# Patient Record
Sex: Female | Born: 1990 | Race: White | Hispanic: No | Marital: Married | Smoking: Never smoker
Health system: Southern US, Community
[De-identification: ages and names within clinical notes are randomized; demographics above are authoritative.]

## PROBLEM LIST (undated history)

## (undated) ENCOUNTER — Ambulatory Visit: Discharge status: Absent without leave | Payer: Medicaid Other

## (undated) DIAGNOSIS — F419 Anxiety disorder, unspecified: Secondary | ICD-10-CM

## (undated) DIAGNOSIS — K439 Ventral hernia without obstruction or gangrene: Secondary | ICD-10-CM

## (undated) DIAGNOSIS — Z8041 Family history of malignant neoplasm of ovary: Secondary | ICD-10-CM

## (undated) DIAGNOSIS — Z87898 Personal history of other specified conditions: Secondary | ICD-10-CM

## (undated) DIAGNOSIS — F32A Depression, unspecified: Secondary | ICD-10-CM

## (undated) DIAGNOSIS — R87619 Unspecified abnormal cytological findings in specimens from cervix uteri: Secondary | ICD-10-CM

## (undated) HISTORY — DX: Family history of malignant neoplasm of ovary: Z80.41

## (undated) HISTORY — DX: Ventral hernia without obstruction or gangrene: K43.9

## (undated) HISTORY — PX: DENTAL SURGERY: SHX609

## (undated) HISTORY — DX: Personal history of other specified conditions: Z87.898

## (undated) HISTORY — DX: Unspecified abnormal cytological findings in specimens from cervix uteri: R87.619

---

## 2017-06-28 ENCOUNTER — Ambulatory Visit: Payer: Self-pay | Admitting: Nurse Practitioner

## 2017-09-11 ENCOUNTER — Ambulatory Visit: Payer: Self-pay | Admitting: Nurse Practitioner

## 2018-01-29 HISTORY — PX: GASTRIC BYPASS: SHX52

## 2018-03-06 ENCOUNTER — Other Ambulatory Visit: Payer: Self-pay

## 2018-03-06 ENCOUNTER — Ambulatory Visit (INDEPENDENT_AMBULATORY_CARE_PROVIDER_SITE_OTHER): Payer: Medicaid Other | Admitting: Nurse Practitioner

## 2018-03-06 ENCOUNTER — Encounter: Payer: Self-pay | Admitting: Nurse Practitioner

## 2018-03-06 VITALS — BP 103/57 | HR 90 | Temp 98.5°F | Resp 17 | Ht 64.5 in | Wt 256.6 lb

## 2018-03-06 DIAGNOSIS — Z7689 Persons encountering health services in other specified circumstances: Secondary | ICD-10-CM

## 2018-03-06 DIAGNOSIS — F411 Generalized anxiety disorder: Secondary | ICD-10-CM

## 2018-03-06 DIAGNOSIS — F5104 Psychophysiologic insomnia: Secondary | ICD-10-CM | POA: Diagnosis not present

## 2018-03-06 MED ORDER — VENLAFAXINE HCL ER 37.5 MG PO CP24: ORAL_CAPSULE | ORAL | 0 refills | Status: DC

## 2018-03-06 NOTE — Progress Notes (Signed)
Subjective:    Patient ID: Tiffany Rhodes, female    DOB: 09-27-1990, 28 y.o.   MRN: [id number]  Tiffany Rhodes is a 28 y.o. female presenting on 03/06/2018 for Establish Care (anxiety, recently relocated to the area )   HPI Establish Care New Provider Pt last seen by [facility] in [place], [place] for her last PCP about 1 year ago.  Obtain records.  Patient has medicaid now in [place] and has improved access to care from initial relocation.  Anxiety and depression Patient had been restarted/refilled at med management clinic.   - Patient is not on fluoxetine currently since February 2019.   Patient reports no past side effects to fluoxetine.  She has always taken both meds concurrently. - Only med recently was buspirone in April 2019 due to intolerance - vomiting and dizziness.  Patient also had intolerance to med at lower dose.  - Patient felt fluoxetine was not completely controlling symptoms even when taking > 6 months.  Had discussed with prior provider changing med.  - Decreased focus, irritability (yells at kids randomly with triggers),  - Denies paralyzing anxiety, panic attack in last 1 year - Always has difficulty with sleep. Some days stares at ceiling or restless other days.  Not new problem.  Racing thoughts are "sometimes."  GAD 7 : Generalized Anxiety Score 03/06/2018  Nervous, Anxious, on Edge 3  Control/stop worrying 2  Worry too much - different things 2  Trouble relaxing 1  Restless 1  Easily annoyed or irritable 3  Afraid - awful might happen 0  Total GAD 7 Score 12  Anxiety Difficulty Extremely difficult   Depression screen PHQ 2/9 03/06/2018  Decreased Interest 1  Down, Depressed, Hopeless 1  PHQ - 2 Score 2  Altered sleeping 3  Tired, decreased energy 1  Change in appetite 1  Feeling bad or failure about yourself  1  Trouble concentrating 2  Moving slowly or fidgety/restless 1  Suicidal thoughts 0  PHQ-9 Score  11  Difficult doing work/chores Not difficult at all   Past Medical History:  Diagnosis Date  . Abnormal Pap smear of cervix    abnormal cells and pos HPV, has had neg after 2016 and 2018 pregnancies  . History of palpitations    during pregnancy - likely patent foramen ovale was only abnormality on echo   History reviewed. No pertinent surgical history.  Family History  Problem Relation Age of Onset  . Ovarian cancer Mother   . Skin cancer Father   . Heart attack Father   . Heart failure Father   . Hypertension Father   . Ovarian cysts Sister   . Breast cancer Maternal Grandmother   . Diabetes Paternal Grandmother   . Skin cancer Paternal Grandfather   . Diabetes Paternal Grandfather    Social History   Socioeconomic History  . Marital status: Married    Spouse name: Not on file  . Number of children: Not on file  . Years of education: Not on file  . Highest education level: Not on file  Occupational History  . Not on file  Social Needs  . Financial resource strain: Not on file  . Food insecurity:    Worry: Not on file    Inability: Not on file  . Transportation needs:    Medical: Not on file    Non-medical: Not on file  Tobacco Use  . Smoking status: Never Smoker  . Smokeless  tobacco: Never Used  Substance and Sexual Activity  . Alcohol use: Yes    Comment: occasionally  . Drug use: Never  . Sexual activity: Yes    Birth control/protection: I.U.D.  Lifestyle  . Physical activity:    Days per week: Not on file    Minutes per session: Not on file  . Stress: Not on file  Relationships  . Social connections:    Talks on phone: Not on file    Gets together: Not on file    Attends religious service: Not on file    Active member of club or organization: Not on file    Attends meetings of clubs or organizations: Not on file    Relationship status: Not on file  Other Topics Concern  . Not on file  Social History Narrative  . Not on file    Review of  Systems  Constitutional: Negative for activity change, appetite change, fatigue and unexpected weight change.  Respiratory: Negative for cough and shortness of breath.   Cardiovascular: Negative for chest pain, palpitations and leg swelling.  Gastrointestinal: Negative for abdominal pain, constipation, diarrhea, nausea and vomiting.  Endocrine: Negative for polydipsia.  Genitourinary: Negative for dysuria, frequency and urgency.  Musculoskeletal: Negative for arthralgias, back pain and myalgias.  Skin: Negative for rash.  Neurological: Negative for dizziness, seizures and headaches.  Psychiatric/Behavioral: Positive for sleep disturbance. Negative for dysphoric mood, hallucinations, self-injury and suicidal ideas. The patient is nervous/anxious. The patient is not hyperactive.    Per HPI unless specifically indicated above     Objective:    BP (!) 103/57 (BP Location: Right Arm, Patient Position: Sitting, Cuff Size: Large)   Pulse 90   Temp 98.5 F (36.9 C) (Oral)   Resp 17   Ht 5' 4.5" (1.638 m)   Wt 256 lb 9.6 oz (116.4 kg)   SpO2 98%   BMI 43.37 kg/m   Wt Readings from Last 3 Encounters:  03/06/18 256 lb 9.6 oz (116.4 kg)    Physical Exam Vitals signs reviewed.  Constitutional:      General: She is not in acute distress.    Appearance: She is well-developed.  HENT:     Head: Normocephalic and atraumatic.  Cardiovascular:     Rate and Rhythm: Normal rate and regular rhythm.     Pulses:          Radial pulses are 2+ on the right side and 2+ on the left side.       Posterior tibial pulses are 1+ on the right side and 1+ on the left side.     Heart sounds: Normal heart sounds, S1 normal and S2 normal.  Pulmonary:     Effort: Pulmonary effort is normal. No respiratory distress.     Breath sounds: Normal breath sounds and air entry.  Musculoskeletal:     Right lower leg: No edema.     Left lower leg: No edema.  Skin:    General: Skin is warm and dry.     Capillary  Refill: Capillary refill takes less than 2 seconds.  Neurological:     Mental Status: She is alert and oriented to person, place, and time.  Psychiatric:        Attention and Perception: Attention normal.        Mood and Affect: Affect normal. Mood is anxious.        Behavior: Behavior is withdrawn and hyperactive (fidgeting). Behavior is cooperative.  Assessment & Plan:   Problem List Items Addressed This Visit      Other   GAD (generalized anxiety disorder) - Primary Chronic GAD now off pharmacologic therapies with gradual worsening causing more difficulty functioning, previously coped well, now affecting physically with fatigue from poor sleep / worrying. Suspected insomnia is secondary to anxiety/mood. -GAD7: 12, extremely difficult / PHQ9: 11 - Prior medication - fluoxetine, buspirone  - No prior dx / Psych / counseling  Plan: 1. Discussion on new diagnosis anxiety, management, complications, likely contributing to insomnia 2. Start venlafaxine XR 37.5 mg once daily for about 7 days, then two tablets daily and continue.   - counseling on potential side effects risks, reviewed possible GI intolerance, insomnia (although likely to improve this given anxiety likely source of insomnia), reviewed black box warning inc suicidal (no prior history, unlikely concern) - anticipate 4-6 weeks for notable effect, may need titrate dose to 20 in future 3. Advised recommend therapy / counseling in future - will given handout locations/names if need  4. Follow-up 4-6 weeks anxiety, med adjust, GAD7/PHQ9    Relevant Medications   venlafaxine XR (EFFEXOR XR) 37.5 MG 24 hr capsule   Psychophysiological insomnia Treat GAD as above. Consider augmentation with Trazodone in future if needed.    Other Visit Diagnoses    Encounter to establish care     Previous PCP was in [place], [place].  Records will be requested.  Past medical, family, and surgical history reviewed w/ pt.        Meds ordered  this encounter  Medications  . venlafaxine XR (EFFEXOR XR) 37.5 MG 24 hr capsule    Sig: Take 1 capsule (37.5 mg total) by mouth daily with breakfast for 7 days, THEN 2 capsules (75 mg total) daily with breakfast for 23 days.    Dispense:  53 capsule    Refill:  0    Order Specific Question:   Supervising Provider    Answer:   Smitty Cords [[id number]]    Follow up plan: Return in about 6 weeks (around 04/17/2018) for anxiety, depression.  Wilhelmina Mcardle, DNP, AGPCNP-BC Adult Gerontology Primary Care Nurse Practitioner [facility] 03/06/2018, 10:34 AM

## 2018-03-06 NOTE — Patient Instructions (Addendum)
Tiffany Rhodes,   Thank you for coming in to clinic today.  1. Sleep Hygiene Tips  Take medicines only as directed by your health care provider.  Keep regular sleeping and waking hours. Avoid naps.  Keep a sleep diary to help you and your health care provider figure out what could be causing your insomnia. Include:  When you sleep.  When you wake up during the night.  How well you sleep.  How rested you feel the next day.  Any side effects of medicines you are taking.  What you eat and drink.  Make your bedroom a comfortable place where it is easy to fall asleep:  Put up shades or special blackout curtains to block light from outside.  Use a white noise machine to block noise.  Keep the temperature cool.  Exercise regularly as directed by your health care provider. Avoid exercising right before bedtime.  Use relaxation techniques to manage stress. Ask your health care provider to suggest some techniques that may work well for you. These may include:  Breathing exercises.  Routines to release muscle tension.  Visualizing peaceful scenes.  Cut back on alcohol, caffeinated beverages, and cigarettes, especially close to bedtime. These can disrupt your sleep.  Do not overeat or eat spicy foods right before bedtime. This can lead to digestive discomfort that can make it hard for you to sleep.  Limit screen use before bedtime. This includes:  Watching TV.  Using your smartphone, tablet, and computer.  Stick to a routine. This can help you fall asleep faster. Try to do a quiet activity, brush your teeth, and go to bed at the same time each night.  Get out of bed if you are still awake after 15 minutes of trying to sleep. Keep the lights down, but try reading or doing a quiet activity. When you feel sleepy, go back to bed.  Make sure that you drive carefully. Avoid driving if you feel very sleepy.  Keep all follow-up appointments as directed by your health  care provider. This is important.  2. Deodorant options: - Certain Dry - [place] or Ban for standard deodorant options similar to prescription strength.  3. START venlafaxine XR capsule Take 1 capusle for 7 days, then increase to 2 capusles once daily and continue.  Please schedule a follow-up appointment with Wilhelmina Mcardle, AGNP. Return in about 6 weeks (around 04/17/2018) for anxiety, depression.  If you have any other questions or concerns, please feel free to call the clinic or send a message through MyChart. You may also schedule an earlier appointment if necessary.  You will receive a survey after today's visit either digitally by e-mail or paper by [employer]. Your experiences and feedback matter to [place].  Please respond so we know how we are doing as we provide care for you.   Wilhelmina Mcardle, DNP, AGNP-BC Adult Gerontology Nurse Practitioner [facility], [facility]

## 2018-03-12 ENCOUNTER — Encounter: Payer: Self-pay | Admitting: Nurse Practitioner

## 2018-03-12 DIAGNOSIS — F5104 Psychophysiologic insomnia: Secondary | ICD-10-CM | POA: Insufficient documentation

## 2018-03-12 DIAGNOSIS — F411 Generalized anxiety disorder: Secondary | ICD-10-CM | POA: Insufficient documentation

## 2018-03-19 DIAGNOSIS — Z6841 Body Mass Index (BMI) 40.0 and over, adult: Secondary | ICD-10-CM | POA: Diagnosis not present

## 2018-03-19 DIAGNOSIS — R635 Abnormal weight gain: Secondary | ICD-10-CM | POA: Diagnosis not present

## 2018-03-19 DIAGNOSIS — Z01818 Encounter for other preprocedural examination: Secondary | ICD-10-CM | POA: Diagnosis not present

## 2018-04-01 DIAGNOSIS — F329 Major depressive disorder, single episode, unspecified: Secondary | ICD-10-CM | POA: Diagnosis not present

## 2018-04-01 DIAGNOSIS — Z6841 Body Mass Index (BMI) 40.0 and over, adult: Secondary | ICD-10-CM | POA: Diagnosis not present

## 2018-04-01 DIAGNOSIS — E786 Lipoprotein deficiency: Secondary | ICD-10-CM | POA: Diagnosis not present

## 2018-04-01 DIAGNOSIS — R0681 Apnea, not elsewhere classified: Secondary | ICD-10-CM | POA: Diagnosis not present

## 2018-04-01 DIAGNOSIS — R0683 Snoring: Secondary | ICD-10-CM | POA: Diagnosis not present

## 2018-04-01 DIAGNOSIS — E781 Pure hyperglyceridemia: Secondary | ICD-10-CM | POA: Diagnosis not present

## 2018-04-04 ENCOUNTER — Ambulatory Visit: Payer: Medicaid Other | Admitting: Nurse Practitioner

## 2018-04-05 ENCOUNTER — Encounter: Payer: Self-pay | Admitting: Nurse Practitioner

## 2018-04-07 ENCOUNTER — Ambulatory Visit (INDEPENDENT_AMBULATORY_CARE_PROVIDER_SITE_OTHER): Payer: Medicaid Other | Admitting: Nurse Practitioner

## 2018-04-07 ENCOUNTER — Other Ambulatory Visit: Payer: Self-pay

## 2018-04-07 ENCOUNTER — Encounter: Payer: Self-pay | Admitting: Nurse Practitioner

## 2018-04-07 VITALS — BP 100/49 | HR 81 | Temp 98.1°F | Ht 64.5 in | Wt 255.4 lb

## 2018-04-07 DIAGNOSIS — F411 Generalized anxiety disorder: Secondary | ICD-10-CM

## 2018-04-07 DIAGNOSIS — F5104 Psychophysiologic insomnia: Secondary | ICD-10-CM

## 2018-04-07 MED ORDER — HYDROXYZINE HCL 10 MG PO TABS: 10.0000 mg | ORAL_TABLET | Freq: Three times a day (TID) | ORAL | 1 refills | Status: DC | PRN

## 2018-04-07 MED ORDER — VENLAFAXINE HCL ER 150 MG PO CP24: 150.0000 mg | ORAL_CAPSULE | Freq: Every day | ORAL | 1 refills | Status: DC

## 2018-04-07 NOTE — Telephone Encounter (Signed)
Discussed in appointment

## 2018-04-07 NOTE — Patient Instructions (Addendum)
Tiffany Rhodes,   Thank you for coming in to clinic today.  1. Continue with venlafaxine - INCREASE your dose to 150 mg once daily.  2. START hydroxyzine 10 mg tablet.  Take 1-2 (10-20 mg) up to three times daily as needed for anxiety or insomnia. - Watch out for sleepiness if taking during the day - don't drive with this medication.  Please schedule a follow-up appointment with Wilhelmina Mcardle, AGNP. Return in about 6 weeks (around 05/19/2018) for anxiety.  If you have any other questions or concerns, please feel free to call the clinic or send a message through MyChart. You may also schedule an earlier appointment if necessary.  You will receive a survey after today's visit either digitally by e-mail or paper by [employer]. Your experiences and feedback matter to [place].  Please respond so we know how we are doing as we provide care for you.   Wilhelmina Mcardle, DNP, AGNP-BC Adult Gerontology Nurse Practitioner [facility], [facility]

## 2018-04-07 NOTE — Progress Notes (Signed)
Subjective:    Patient ID: Tiffany Rhodes, female    DOB: 1991-01-08, 28 y.o.   MRN: [id number]  Tiffany Rhodes is a 28 y.o. female presenting on 04/07/2018 for Anxiety   HPI Anxiety, Insomnia, Decreased Libido (Patient is asking about HSDD)  Symptoms remain uncontrolled.  Currently taking 75 mg daily venlafaxine.  Patient states there is not much difference.  Feels short tempered and easily irritable.  Some days, sleeps well.  Other nights are very restless.   - Has overwhelming anxiety that feels like a "train in my head."  - everything coming at once and barreling own.  - Racing thoughts are disrupting sleep.  [facility] at [facility] is planned for weight loss workup. 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 Patient desires a variation of surgery that was no approved for BMI < 50.  May have additional input for insomnia after this. - Patient also reports concern about HSDD.  She is desiring increased sexual intercourse in her relationship, but is not currently having a desire to have sex.  This is impacting relationship satisfaction.   GAD 7 : Generalized Anxiety Score 04/07/2018 03/06/2018  Nervous, Anxious, on Edge 3 3  Control/stop worrying 2 2  Worry too much - different things 2 2  Trouble relaxing 3 1  Restless 2 1  Easily annoyed or irritable 3 3  Afraid - awful might happen 1 0  Total GAD 7 Score 16 12  Anxiety Difficulty Very difficult Extremely difficult   Depression screen [facility] 2/9 04/07/2018 03/06/2018  Decreased Interest 2 1  Down, Depressed, Hopeless 2 1  PHQ - 2 Score 4 2  Altered sleeping 3 3  Tired, decreased energy 3 1  Change in appetite 1 1  Feeling bad or failure about yourself  1 1  Trouble concentrating 2 2  Moving slowly or fidgety/restless 0 1  Suicidal thoughts 0 0  PHQ-9 Score 14 11  Difficult doing work/chores Somewhat difficult Not difficult at all    Social History   Tobacco Use  . Smoking status: Never Smoker  .  Smokeless tobacco: Never Used  Substance Use Topics  . Alcohol use: Yes    Comment: occasionally  . Drug use: Never    Review of Systems Per HPI unless specifically indicated above     Objective:    BP (!) 100/49 (BP Location: Right Arm, Patient Position: Sitting, Cuff Size: Large)   Pulse 81   Temp 98.1 F (36.7 C) (Oral)   Ht 5' 4.5" (1.638 m)   Wt 255 lb 6.4 oz (115.8 kg)   BMI 43.16 kg/m   Wt Readings from Last 3 Encounters:  04/07/18 255 lb 6.4 oz (115.8 kg)  03/06/18 256 lb 9.6 oz (116.4 kg)    Physical Exam Vitals signs reviewed.  Constitutional:      General: She is not in acute distress.    Appearance: She is well-developed.  HENT:     Head: Normocephalic and atraumatic.  Cardiovascular:     Rate and Rhythm: Normal rate and regular rhythm.     Pulses:          Radial pulses are 2+ on the right side and 2+ on the left side.       Posterior tibial pulses are 1+ on the right side and 1+ on the left side.     Heart sounds: Normal heart sounds, S1 normal and S2 normal.  Pulmonary:  Effort: Pulmonary effort is normal. No respiratory distress.     Breath sounds: Normal breath sounds and air entry.  Abdominal:     General: Bowel sounds are normal. There is no distension.     Palpations: Abdomen is soft.     Tenderness: There is no abdominal tenderness.     Hernia: No hernia is present.  Musculoskeletal:     Right lower leg: No edema.     Left lower leg: No edema.  Skin:    General: Skin is warm and dry.     Capillary Refill: Capillary refill takes less than 2 seconds.  Neurological:     General: No focal deficit present.     Mental Status: She is alert and oriented to person, place, and time. Mental status is at baseline.  Psychiatric:        Attention and Perception: Attention normal.        Mood and Affect: Mood and affect normal.        Speech: Speech normal.        Behavior: Behavior is withdrawn. Behavior is cooperative.        Thought Content:  Thought content normal. Thought content does not include homicidal or suicidal ideation. Thought content does not include homicidal or suicidal plan.        Cognition and Memory: Cognition and memory normal.        Judgment: Judgment normal.       Assessment & Plan:   Problem List Items Addressed This Visit      Other   GAD (generalized anxiety disorder) - Primary   Relevant Medications   venlafaxine XR (EFFEXOR-XR) 150 MG 24 hr capsule   hydrOXYzine (ATARAX/VISTARIL) 10 MG tablet   Psychophysiological insomnia   Relevant Medications   hydrOXYzine (ATARAX/VISTARIL) 10 MG tablet    GAD remains uncontrolled without significant improvement with venlafaxine to date.  Patient tolerates well. - Previously has had SSRI failure.  Plan: 1. INCREASE venlafaxine 24 hr to 150 mg once daily 2. START hydroxyzine10-20 mg up to tid prn anxiety and insomnia. - Discussed Trazodone for future. 3. Encouraged non-pharm management strategies of exercise, deep breathing. 4. Follow-up 6 weeks.  May need psychiatry if continues to fail SSRI/SNRI therapy.    Meds ordered this encounter  Medications  . venlafaxine XR (EFFEXOR-XR) 150 MG 24 hr capsule    Sig: Take 1 capsule (150 mg total) by mouth daily with breakfast.    Dispense:  30 capsule    Refill:  1    Order Specific Question:   Supervising Provider    Answer:   Smitty Cords [[id number]]  . hydrOXYzine (ATARAX/VISTARIL) 10 MG tablet    Sig: Take 1-2 tablets (10-20 mg total) by mouth 3 (three) times daily as needed for anxiety (and sleep).    Dispense:  60 tablet    Refill:  1    Order Specific Question:   Supervising Provider    Answer:   Smitty Cords [[id number]]    Follow up plan: Return in about 6 weeks (around 05/19/2018) for anxiety.  A total of 30 minutes was spent face-to-face with this patient. Greater than 50% of this time was spent in counseling and coordination of care with the patient.    Wilhelmina Mcardle, DNP,  AGPCNP-BC Adult Gerontology Primary Care Nurse Practitioner [facility] 04/07/2018, 8:17 AM

## 2018-04-09 ENCOUNTER — Encounter: Payer: Self-pay | Admitting: Nurse Practitioner

## 2018-04-10 ENCOUNTER — Encounter: Payer: Self-pay | Admitting: Nurse Practitioner

## 2018-04-15 ENCOUNTER — Ambulatory Visit: Payer: Medicaid Other | Admitting: Nurse Practitioner

## 2018-04-16 ENCOUNTER — Encounter: Payer: Self-pay | Admitting: Nurse Practitioner

## 2018-04-21 ENCOUNTER — Ambulatory Visit (INDEPENDENT_AMBULATORY_CARE_PROVIDER_SITE_OTHER): Payer: Medicaid Other | Admitting: Nurse Practitioner

## 2018-04-21 ENCOUNTER — Other Ambulatory Visit: Payer: Self-pay

## 2018-04-21 ENCOUNTER — Encounter: Payer: Self-pay | Admitting: Nurse Practitioner

## 2018-04-21 VITALS — BP 103/58 | HR 82 | Temp 98.4°F | Ht 64.5 in | Wt 255.4 lb

## 2018-04-21 DIAGNOSIS — F5104 Psychophysiologic insomnia: Secondary | ICD-10-CM

## 2018-04-21 DIAGNOSIS — K047 Periapical abscess without sinus: Secondary | ICD-10-CM | POA: Diagnosis not present

## 2018-04-21 DIAGNOSIS — F411 Generalized anxiety disorder: Secondary | ICD-10-CM

## 2018-04-21 MED ORDER — HYDROXYZINE HCL 25 MG PO TABS: 25.0000 mg | ORAL_TABLET | Freq: Three times a day (TID) | ORAL | 1 refills | Status: DC | PRN

## 2018-04-21 MED ORDER — AMOXICILLIN 500 MG PO TABS: 500.0000 mg | ORAL_TABLET | Freq: Two times a day (BID) | ORAL | 0 refills | Status: AC

## 2018-04-21 NOTE — Patient Instructions (Addendum)
Tiffany Rhodes,   Thank you for coming in to clinic today.  1. START amoxicillin 500 mg one tablet every 12 hours for 7 days. - Get a dentist appointment - we can supply single dose Ativan for sedation prior to appointment if needed and allowed by your dentist.  You would require a driver to and from your appointment with this medication.  Please schedule a follow-up appointment with Wilhelmina Mcardle, AGNP. Return if symptoms worsen or fail to improve.  If you have any other questions or concerns, please feel free to call the clinic or send a message through MyChart. You may also schedule an earlier appointment if necessary.  You will receive a survey after today's visit either digitally by e-mail or paper by [employer]. Your experiences and feedback matter to [place].  Please respond so we know how we are doing as we provide care for you.   Wilhelmina Mcardle, DNP, AGNP-BC Adult Gerontology Nurse Practitioner [facility], [facility]

## 2018-04-21 NOTE — Progress Notes (Signed)
Subjective:    Patient ID: Tiffany Rhodes, female    DOB: 16-Apr-1990, 28 y.o.   MRN: [id number]  Tiffany Rhodes is a 28 y.o. female presenting on 04/21/2018 for Dental Pain (lower swelling on the bilateral side of her jaw. Possible related to infection that's starting to swell her bottom jaw. Pt states she had a fever last Wednesday & Thursday of 100. 2. Pt admits that she now taking excedrin Migraine twice daily)   HPI Dental Pain  Patient notes pain started 2 weeks ago.  Worsened on Tuesday.  Was unable to donate plasma on Wed and Thursday.  Messaged on Thursday.  Lower jaw was stil swollen, radiating from lower jaw up to right ear.   - Last dental abscess was 2017.  No recent antibiotics. -   Social History   Tobacco Use  . Smoking status: Never Smoker  . Smokeless tobacco: Never Used  Substance Use Topics  . Alcohol use: Yes    Comment: occasionally  . Drug use: Never    Review of Systems Per HPI unless specifically indicated above     Objective:    BP (!) 103/58 (BP Location: Right Arm, Patient Position: Sitting, Cuff Size: Large)   Pulse 82   Temp 98.4 F (36.9 C) (Oral)   Ht 5' 4.5" (1.638 m)   Wt 255 lb 6.4 oz (115.8 kg)   BMI 43.16 kg/m   Wt Readings from Last 3 Encounters:  04/21/18 255 lb 6.4 oz (115.8 kg)  04/07/18 255 lb 6.4 oz (115.8 kg)  03/06/18 256 lb 9.6 oz (116.4 kg)    Physical Exam Vitals signs reviewed.  Constitutional:      General: She is not in acute distress.    Appearance: She is well-developed.  HENT:     Head: Normocephalic and atraumatic.     Right Ear: Hearing, tympanic membrane, ear canal and external ear normal.     Left Ear: Hearing, tympanic membrane, ear canal and external ear normal.     Nose: Nose normal.     Mouth/Throat:     Lips: Pink.     Mouth: Mucous membranes are moist.     Dentition: Abnormal dentition.     Tonsils: 0 on the right. 0 on the left.   Lymphadenopathy:     Head:     Right  side of head: Submental and submandibular adenopathy present. No tonsillar, preauricular, posterior auricular or occipital adenopathy.     Left side of head: No submental, submandibular, tonsillar, preauricular, posterior auricular or occipital adenopathy.     Cervical: No cervical adenopathy.  Skin:    General: Skin is warm and dry.     Capillary Refill: Capillary refill takes less than 2 seconds.  Neurological:     General: No focal deficit present.     Mental Status: She is alert and oriented to person, place, and time. Mental status is at baseline.  Psychiatric:        Mood and Affect: Mood normal.        Behavior: Behavior normal.        Thought Content: Thought content normal.        Judgment: Judgment normal.       Assessment & Plan:   Problem List Items Addressed This Visit      Other   GAD (generalized anxiety disorder)   Relevant Medications   hydrOXYzine (ATARAX/VISTARIL) 25 MG tablet   Psychophysiological insomnia   Relevant Medications  hydrOXYzine (ATARAX/VISTARIL) 25 MG tablet    Other Visit Diagnoses    Dental infection    -  Primary   Relevant Medications   amoxicillin (AMOXIL) 500 MG tablet     Acute dental, gingival infection of right lower molars.  Beginning to resolve, but with persistent mandible pain.  Plan: 1. START amoxicillin 500 mg one tab every 12 hrs for 7 days. 2. Patient with severe anxiety for dental care - will provide procedural anxiolytic if dentist will allow (Ativan 0.5 mg dose x 1) in future.   3. Follow-up prn.  Refill sent for hydroxyzine - problem not covered in detail during this visit.  Meds ordered this encounter  Medications  . amoxicillin (AMOXIL) 500 MG tablet    Sig: Take 1 tablet (500 mg total) by mouth every 12 (twelve) hours for 7 days.    Dispense:  14 tablet    Refill:  0    Order Specific Question:   Supervising Provider    Answer:   Smitty Cords [[id number]]  . hydrOXYzine (ATARAX/VISTARIL) 25 MG tablet     Sig: Take 1 tablet (25 mg total) by mouth 3 (three) times daily as needed for anxiety (and sleep).    Dispense:  60 tablet    Refill:  1    Order Specific Question:   Supervising Provider    Answer:   Smitty Cords [[id number]]   Follow up plan: Return if symptoms worsen or fail to improve.  Wilhelmina Mcardle, DNP, AGPCNP-BC Adult Gerontology Primary Care Nurse Practitioner [facility] 04/21/2018, 9:47 AM

## 2018-04-30 DIAGNOSIS — Z713 Dietary counseling and surveillance: Secondary | ICD-10-CM | POA: Diagnosis not present

## 2018-05-05 ENCOUNTER — Encounter: Payer: Self-pay | Admitting: Nurse Practitioner

## 2018-05-05 DIAGNOSIS — K047 Periapical abscess without sinus: Secondary | ICD-10-CM

## 2018-05-05 MED ORDER — AMOXICILLIN-POT CLAVULANATE 875-125 MG PO TABS: 1.0000 | ORAL_TABLET | Freq: Two times a day (BID) | ORAL | 0 refills | Status: AC

## 2018-05-06 ENCOUNTER — Encounter: Payer: Self-pay | Admitting: Emergency Medicine

## 2018-05-06 ENCOUNTER — Other Ambulatory Visit: Payer: Self-pay

## 2018-05-06 ENCOUNTER — Emergency Department
Admission: EM | Admit: 2018-05-06 | Discharge: 2018-05-06 | Disposition: A | Discharge status: Home / Self Care | Payer: Medicaid Other | Attending: Emergency Medicine | Admitting: Emergency Medicine

## 2018-05-06 DIAGNOSIS — Z79899 Other long term (current) drug therapy: Secondary | ICD-10-CM | POA: Insufficient documentation

## 2018-05-06 DIAGNOSIS — K0889 Other specified disorders of teeth and supporting structures: Secondary | ICD-10-CM | POA: Diagnosis present

## 2018-05-06 DIAGNOSIS — K047 Periapical abscess without sinus: Secondary | ICD-10-CM | POA: Insufficient documentation

## 2018-05-06 MED ORDER — IBUPROFEN 800 MG PO TABS
800.0000 mg | ORAL_TABLET | Freq: Once | ORAL | Status: AC
  Administered 2018-05-06: 22:00:00 800 mg via ORAL
  Filled 2018-05-06: qty 1

## 2018-05-06 MED ORDER — LIDOCAINE HCL (PF) 1 % IJ SOLN
INTRAMUSCULAR | Status: AC
  Administered 2018-05-06: 22:00:00
  Filled 2018-05-06: qty 5

## 2018-05-06 MED ORDER — CLINDAMYCIN HCL 150 MG PO CAPS
300.0000 mg | ORAL_CAPSULE | Freq: Once | ORAL | Status: AC
  Administered 2018-05-06: 300 mg via ORAL
  Filled 2018-05-06: qty 2

## 2018-05-06 MED ORDER — CLINDAMYCIN HCL 300 MG PO CAPS: 300.0000 mg | ORAL_CAPSULE | Freq: Three times a day (TID) | ORAL | 0 refills | Status: DC

## 2018-05-06 NOTE — ED Triage Notes (Signed)
Patient to ER for c/o "I know I have an infection and now I'm feeling sweaty and shaky.". Patient flushed in triage, but does not have thermometer at home to determine if she has had fever. Patient also c/o shortness of breath. Patient reports having dental infection (was seen at [facility] and given amoxicillin).

## 2018-05-06 NOTE — Discharge Instructions (Signed)
Please follow-up with your doctor in 2 days for recheck/reevaluation.  You will likely need to follow-up with a dentist as well in the next 2 days.  Return to the emergency department for any increase in pain or swelling, any trouble swallowing or breathing, or development of fever, or any other symptom personally concerning to yourself.  Please fill and begin taking clindamycin starting tomorrow morning 05/06/2021 times daily for 10 days.

## 2018-05-06 NOTE — ED Provider Notes (Signed)
[facility] Emergency Department Provider Note  Time seen: 9:13 PM  I have reviewed the triage vital signs and the nursing notes.   HISTORY  Chief Complaint Dental Pain and Shortness of Breath   HPI Tiffany Rhodes is a 28 y.o. female with a past medical history of anxiety, presents to the emergency department for right-sided facial pain and swelling.  According to the patient approximately 10 days ago she developed pain in her right lower jaw/teeth with swelling to this area.  Patient denies any fever.  Saw her doctor who wrote her a 7-day course of amoxicillin which the patient recently finished.  Patient has an appointment again with her doctor at the end of the week, but states over the past 2 to 3 days the area has become more swollen and tender so she came to the emergency department.  Patient denies any fever at any point.  Denies any cough congestion or shortness of breath.  No travel.   Past Medical History:  Diagnosis Date  . Abnormal Pap smear of cervix    abnormal cells and pos HPV, has had neg after 2016 and 2018 pregnancies  . History of palpitations    during pregnancy - likely patent foramen ovale was only abnormality on echo    Patient Active Problem List   Diagnosis Date Noted  . GAD (generalized anxiety disorder) 03/12/2018  . Psychophysiological insomnia 03/12/2018    History reviewed. No pertinent surgical history.  Prior to Admission medications   Medication Sig Start Date End Date Taking? Authorizing Provider  amoxicillin-clavulanate (AUGMENTIN) 875-125 MG tablet Take 1 tablet by mouth 2 (two) times daily for 7 days. 05/05/18 05/12/18  Galen ManilaKennedy, Lauren Renee, NP  hydrOXYzine (ATARAX/VISTARIL) 25 MG tablet Take 1 tablet (25 mg total) by mouth 3 (three) times daily as needed for anxiety (and sleep). 04/21/18   Galen ManilaKennedy, Lauren Renee, NP  levonorgestrel (MIRENA) 20 MCG/24HR IUD 1 each by Intrauterine route once.    [provider]  venlafaxine XR (EFFEXOR-XR) 150 MG 24 hr capsule Take 1 capsule (150 mg total) by mouth daily with breakfast. 04/07/18   Galen ManilaKennedy, Lauren Renee, NP    Allergies  Allergen Reactions  . Buspirone Nausea And Vomiting    Family History  Problem Relation Age of Onset  . Ovarian cancer Mother   . Skin cancer Father   . Heart attack Father   . Heart failure Father   . Hypertension Father   . Ovarian cysts Sister   . Breast cancer Maternal Grandmother   . Diabetes Paternal Grandmother   . Skin cancer Paternal Grandfather   . Diabetes Paternal Grandfather     Social History Social History   Tobacco Use  . Smoking status: Never Smoker  . Smokeless tobacco: Never Used  Substance Use Topics  . Alcohol use: Yes    Comment: occasionally  . Drug use: Never    Review of Systems Constitutional: Negative for fever. ENT: Right lower dental pain/facial tenderness and swelling Cardiovascular: Negative for chest pain. Respiratory: Negative for shortness of breath. Gastrointestinal: Negative for abdominal pain, vomiting  Musculoskeletal: Negative for musculoskeletal complaints Skin: Negative for skin complaints  Neurological: Negative for headache All other ROS negative  ____________________________________________   PHYSICAL EXAM:  VITAL SIGNS: ED Triage Vitals  Enc Vitals Group     BP 05/06/18 2049 114/75     Pulse Rate 05/06/18 2049 (!) 128     Resp 05/06/18 2049 20  Temp 05/06/18 2049 98 F (36.7 C)     Temp Source 05/06/18 2049 Oral     SpO2 05/06/18 2049 98 %     Weight 05/06/18 2050 255 lb (115.7 kg)     Height 05/06/18 2050 5\' 4"  (1.626 m)     Head Circumference --      Peak Flow --      Pain Score 05/06/18 2049 7     Pain Loc --      Pain Edu? --      Excl. in GC? --     Constitutional: Alert and oriented.  Mildly anxious in appearance. Eyes: Normal exam ENT   Head: Normocephalic and atraumatic.   Mouth/Throat: Mucous membranes are  moist.  Patient has swelling and tenderness along the right lower gums most consistent with dental infection/abscess.  It appears to be pointing in several areas.  Floor the mouth is soft.  Submandibular area is soft. Cardiovascular: Regular rhythm, rate around 120 bpm.  No murmur. Respiratory: Normal respiratory effort without tachypnea nor retractions. Breath sounds are clear and equal bilaterally. No wheezes/rales/rhonchi. Gastrointestinal: Soft and nontender. No distention.  Musculoskeletal: Nontender with normal range of motion in all extremities.  Neurologic:  Normal speech and language. No gross focal neurologic deficits Skin:  Skin is warm, dry and intact.  Psychiatric: Mood and affect are normal.   ____________________________________________   INITIAL IMPRESSION / ASSESSMENT AND PLAN / ED COURSE  Pertinent labs & imaging results that were available during my care of the patient were reviewed by me and considered in my medical decision making (see chart for details).  Patient presents to the emergency department for pain and swelling to her right lower face.  States chronic dental pain to this area, now with swelling and exam most consistent with dental abscess that is pointing at this time.  We will use Xylocaine to numb the area, and incised to drain the abscess.  Patient finished amoxicillin we will switch to clindamycin.  Anticipate likely discharge home.  Abscess was drained by myself with an 18-gauge needle incision.  Approximately 2 to 3 cc of pus removed from the abscess.  We will place the patient on clindamycin.  I discussed very strict return precautions for any worsening swelling, any swelling under the jaw or throat, any trouble breathing or difficulty swallowing.  Patient will follow-up with her doctor in 2 days for recheck.  Patient agreeable to plan of care.  INCISION AND DRAINAGE Performed by: Minna Antis Consent: Verbal consent obtained. Risks and benefits:  risks, benefits and alternatives were discussed Type: abscess  Body area: Right lower molar/gingiva  Anesthesia: local infiltration  Incision was made with an 18ga needle tip  Local anesthetic: lidocaine 1% wo epinephrine  Anesthetic total: 3 ml  Complexity: complex Blunt dissection to break up loculations  Drainage: purulent  Drainage amount: 2-3cc  Patient tolerance: Patient tolerated the procedure well with no immediate complications.     ____________________________________________   FINAL CLINICAL IMPRESSION(S) / ED DIAGNOSES  Dental abscess   Minna Antis, MD 05/06/18 2206

## 2018-05-08 DIAGNOSIS — G4710 Hypersomnia, unspecified: Secondary | ICD-10-CM | POA: Diagnosis not present

## 2018-05-12 ENCOUNTER — Ambulatory Visit: Payer: Medicaid Other | Admitting: Nurse Practitioner

## 2018-05-12 ENCOUNTER — Other Ambulatory Visit: Payer: Self-pay

## 2018-05-15 ENCOUNTER — Encounter: Payer: Self-pay | Admitting: Nurse Practitioner

## 2018-05-15 DIAGNOSIS — F329 Major depressive disorder, single episode, unspecified: Secondary | ICD-10-CM | POA: Diagnosis not present

## 2018-05-15 DIAGNOSIS — E669 Obesity, unspecified: Secondary | ICD-10-CM | POA: Diagnosis not present

## 2018-05-15 DIAGNOSIS — E781 Pure hyperglyceridemia: Secondary | ICD-10-CM | POA: Diagnosis not present

## 2018-05-15 DIAGNOSIS — Z6841 Body Mass Index (BMI) 40.0 and over, adult: Secondary | ICD-10-CM | POA: Diagnosis not present

## 2018-05-15 DIAGNOSIS — Z713 Dietary counseling and surveillance: Secondary | ICD-10-CM | POA: Diagnosis not present

## 2018-05-16 ENCOUNTER — Ambulatory Visit (INDEPENDENT_AMBULATORY_CARE_PROVIDER_SITE_OTHER): Payer: Medicaid Other | Admitting: Nurse Practitioner

## 2018-05-16 ENCOUNTER — Encounter: Payer: Self-pay | Admitting: Nurse Practitioner

## 2018-05-16 ENCOUNTER — Other Ambulatory Visit: Payer: Self-pay

## 2018-05-16 DIAGNOSIS — F5104 Psychophysiologic insomnia: Secondary | ICD-10-CM | POA: Diagnosis not present

## 2018-05-16 DIAGNOSIS — F411 Generalized anxiety disorder: Secondary | ICD-10-CM

## 2018-05-16 DIAGNOSIS — R41840 Attention and concentration deficit: Secondary | ICD-10-CM

## 2018-05-16 MED ORDER — DULOXETINE HCL 30 MG PO CPEP: 30.0000 mg | ORAL_CAPSULE | Freq: Every day | ORAL | 5 refills | Status: DC

## 2018-05-16 MED ORDER — HYDROXYZINE HCL 25 MG PO TABS: 25.0000 mg | ORAL_TABLET | Freq: Every evening | ORAL | 2 refills | Status: DC | PRN

## 2018-05-16 NOTE — Progress Notes (Signed)
Telemedicine Encounter: Disclosed to patient at start of encounter that we will provide appropriate telemedicine services.  Patient consents to be treated via telemedicine with synchronous video/audio using [email or web address]  prior to discussion. - Patient is at her home and is accessed via [email or web address]. - Services are provided by Wilhelmina Mcardle from [facility].  Subjective:    Patient ID: Tiffany Rhodes, female    DOB: 1990/07/28, 28 y.o.   MRN: [id number]  Tiffany Rhodes is a 28 y.o. female presenting on 05/16/2018 for Anxiety (pt been off her Effexor x 3 days. )  HPI Anxiety Patient has stopped her Effexor for last 3 days due to wanting to possibly change her medications.  Patient states Effexor has not helped her moods/anxiety any. - Patient notes irritability and inattention are her most bothersome symptoms of anxiety. - Also notes inattention is long-term ("I've always had this") and that she has tried going back to school 4 times without completion. - Patient has had failure of Effexor, and buspirone, fluoxetine in past.  Buspirone and fluoxetine had been initiated by a prior PCP.  Patient has never had psychiatry evaluation. - Current circumstances with all her children, husband, self home all the time are making anxiety mildly worse.    GAD 7 : Generalized Anxiety Score 05/16/2018 04/07/2018 03/06/2018  Nervous, Anxious, on Edge 3 3 3   Control/stop worrying 3 2 2   Worry too much - different things 1 2 2   Trouble relaxing 3 3 1   Restless 1 2 1   Easily annoyed or irritable 3 3 3   Afraid - awful might happen 1 1 0  Total GAD 7 Score 15 16 12   Anxiety Difficulty Extremely difficult Very difficult Extremely difficult   Depression screen [facility] 2/9 05/16/2018 04/07/2018 03/06/2018  Decreased Interest 1 2 1   Down, Depressed, Hopeless 1 2 1   PHQ - 2 Score 2 4 2   Altered sleeping 3 3 3   Tired, decreased energy 3 3 1   Change in appetite 1 1 1   Feeling bad or failure  about yourself  1 1 1   Trouble concentrating 3 2 2   Moving slowly or fidgety/restless 3 0 1  Suicidal thoughts 0 0 0  PHQ-9 Score 16 14 11   Difficult doing work/chores Very difficult Somewhat difficult Not difficult at all   Social History   Tobacco Use  . Smoking status: Never Smoker  . Smokeless tobacco: Never Used  Substance Use Topics  . Alcohol use: Yes    Comment: occasionally  . Drug use: Never    Review of Systems Per HPI unless specifically indicated above     Objective:    There were no vitals taken for this visit.  Wt Readings from Last 3 Encounters:  05/06/18 255 lb (115.7 kg)  04/21/18 255 lb 6.4 oz (115.8 kg)  04/07/18 255 lb 6.4 oz (115.8 kg)    Physical Exam Patient remotely monitored.  Non-verbal video presence and verbal communication appropriate.  Cognition normal.   No results found for this or any previous visit.    Assessment & Plan:   Problem List Items Addressed This Visit      Other   GAD (generalized anxiety disorder) - Primary   Relevant Medications   hydrOXYzine (ATARAX/VISTARIL) 25 MG tablet   DULoxetine (CYMBALTA) 30 MG capsule   Psychophysiological insomnia   Relevant Medications   hydrOXYzine (ATARAX/VISTARIL) 25 MG tablet    Other Visit Diagnoses    Difficulty concentrating  Relevant Medications   DULoxetine (CYMBALTA) 30 MG capsule      Anxiety and depression are severe on GAD7 and PHQ9 scores today, but are stable over time.  Coping skills are stable at this point, but are inadequate to appropriately manage chronic anxiety.   Predominant symptoms of inattention and irritability also indicate patient should be evaluated for concurrent ADHD which may help with anxiety treatment.  Plan: 1. Provided ADHD evaluation resources.  Instructed patient to call and schedule. 2. Stop Effexor 3. START duloxetine 30 mg once daily - May need to increase dose for full effect if partial effect is achieved 4. Continue hydroxyzine at  bedtime.  Can take 25-50 mg daily. 5. Encouraged ongoing non-pharm treatment measures including exercise, deep breathing. 6. FOLLOW-UP 6 weeks or sooner if needed. Should consider psychiatry eval in future if not getting improvement with 3rd/4th SSRI/SNRI agents.   Patient declined referral today.  Meds ordered this encounter  Medications  . hydrOXYzine (ATARAX/VISTARIL) 25 MG tablet    Sig: Take 1-2 tablets (25-50 mg total) by mouth at bedtime as needed for anxiety (and sleep).    Dispense:  60 tablet    Refill:  2    Order Specific Question:   Supervising Provider    Answer:   Smitty CordsKARAMALEGOS, ALEXANDER J [[id number]]  . DULoxetine (CYMBALTA) 30 MG capsule    Sig: Take 1 capsule (30 mg total) by mouth daily.    Dispense:  30 capsule    Refill:  5    Order Specific Question:   Supervising Provider    Answer:   Smitty CordsKARAMALEGOS, ALEXANDER J [[id number]]   - Time spent in direct consultation with patient via telemedicine about above concerns: 14 minutes  Follow up plan: Return in about 6 weeks (around 06/27/2018) for anxiety, inattention.  Wilhelmina McardleLauren Garreth Burnsworth, DNP, AGPCNP-BC Adult Gerontology Primary Care Nurse Practitioner [facility] 05/16/2018, 9:39 AM

## 2018-05-16 NOTE — Patient Instructions (Signed)
Tiffany Rhodes,   Thank you for coming in to clinic by video visit today.  1. You can get your ADHD testing done at any of the following locations: 1. Drema Balzarine PHD, local Psychologist  [address]  [place], [place] [address]  [phone]  2. [facility]  [address]   [place], [place] [address]   Phone [phone]   3. [facility] (will also do medication management)  Ground Floor of the [facility] just off the lobby  [address]  [place], [place] [address]  Phone: [phone]  If we find you have a component of ADHD (hyperactivity or inattention), we can treat that to possibly help with anxiety control.   2. STOP effexor START duloxetine 30 mg once daily.  This is for anxiety and will take 4-8 weeks to reach full effect.  3. Continue hydroxyzine 25-50 mg at bedtime for anxiety/sleep  4. Consider future psychiatry for medication management if we are unable to start seeing improvement with duloxetine.   Please schedule a follow-up appointment with Wilhelmina Mcardle, AGNP. Return in about 6 weeks (around 06/27/2018) for anxiety, inattention.  If you have any other questions or concerns, please feel free to call the clinic or send a message through MyChart. You may also schedule an earlier appointment if necessary.  You will receive a survey after today's visit either digitally by e-mail or paper by [employer]. Your experiences and feedback matter to [place].  Please respond so we know how we are doing as we provide care for you.   Wilhelmina Mcardle, DNP, AGNP-BC Adult Gerontology Nurse Practitioner [facility]

## 2018-06-13 DIAGNOSIS — Z6841 Body Mass Index (BMI) 40.0 and over, adult: Secondary | ICD-10-CM | POA: Diagnosis not present

## 2018-06-13 DIAGNOSIS — Z713 Dietary counseling and surveillance: Secondary | ICD-10-CM | POA: Diagnosis not present

## 2018-06-13 DIAGNOSIS — E669 Obesity, unspecified: Secondary | ICD-10-CM | POA: Diagnosis not present

## 2018-06-28 ENCOUNTER — Encounter: Payer: Self-pay | Admitting: Nurse Practitioner

## 2018-07-16 DIAGNOSIS — H5213 Myopia, bilateral: Secondary | ICD-10-CM | POA: Diagnosis not present

## 2018-07-17 ENCOUNTER — Other Ambulatory Visit: Payer: Self-pay | Admitting: Nurse Practitioner

## 2018-07-17 DIAGNOSIS — F411 Generalized anxiety disorder: Secondary | ICD-10-CM

## 2018-07-17 DIAGNOSIS — H5213 Myopia, bilateral: Secondary | ICD-10-CM | POA: Diagnosis not present

## 2018-07-17 DIAGNOSIS — Z6841 Body Mass Index (BMI) 40.0 and over, adult: Secondary | ICD-10-CM | POA: Diagnosis not present

## 2018-07-17 DIAGNOSIS — F5104 Psychophysiologic insomnia: Secondary | ICD-10-CM

## 2018-07-17 DIAGNOSIS — Z713 Dietary counseling and surveillance: Secondary | ICD-10-CM | POA: Diagnosis not present

## 2018-07-23 DIAGNOSIS — F39 Unspecified mood [affective] disorder: Secondary | ICD-10-CM | POA: Diagnosis not present

## 2018-07-23 DIAGNOSIS — F419 Anxiety disorder, unspecified: Secondary | ICD-10-CM | POA: Diagnosis not present

## 2018-08-07 DIAGNOSIS — F17200 Nicotine dependence, unspecified, uncomplicated: Secondary | ICD-10-CM | POA: Diagnosis not present

## 2018-08-26 DIAGNOSIS — Z01818 Encounter for other preprocedural examination: Secondary | ICD-10-CM | POA: Diagnosis not present

## 2018-09-12 DIAGNOSIS — Z01818 Encounter for other preprocedural examination: Secondary | ICD-10-CM | POA: Diagnosis not present

## 2018-09-12 DIAGNOSIS — Z6841 Body Mass Index (BMI) 40.0 and over, adult: Secondary | ICD-10-CM | POA: Diagnosis not present

## 2018-10-14 DIAGNOSIS — H5213 Myopia, bilateral: Secondary | ICD-10-CM | POA: Diagnosis not present

## 2018-10-27 DIAGNOSIS — Z01818 Encounter for other preprocedural examination: Secondary | ICD-10-CM | POA: Diagnosis not present

## 2018-11-04 DIAGNOSIS — Z01812 Encounter for preprocedural laboratory examination: Secondary | ICD-10-CM | POA: Diagnosis not present

## 2018-11-04 DIAGNOSIS — Z01818 Encounter for other preprocedural examination: Secondary | ICD-10-CM | POA: Diagnosis not present

## 2018-11-04 DIAGNOSIS — Z20828 Contact with and (suspected) exposure to other viral communicable diseases: Secondary | ICD-10-CM | POA: Diagnosis not present

## 2018-11-10 DIAGNOSIS — Z6841 Body Mass Index (BMI) 40.0 and over, adult: Secondary | ICD-10-CM | POA: Diagnosis not present

## 2018-11-10 DIAGNOSIS — F418 Other specified anxiety disorders: Secondary | ICD-10-CM | POA: Diagnosis not present

## 2018-11-10 DIAGNOSIS — K430 Incisional hernia with obstruction, without gangrene: Secondary | ICD-10-CM | POA: Diagnosis not present

## 2018-11-10 DIAGNOSIS — F39 Unspecified mood [affective] disorder: Secondary | ICD-10-CM | POA: Diagnosis not present

## 2018-12-10 DIAGNOSIS — Z9884 Bariatric surgery status: Secondary | ICD-10-CM | POA: Diagnosis not present

## 2018-12-10 DIAGNOSIS — Z713 Dietary counseling and surveillance: Secondary | ICD-10-CM | POA: Diagnosis not present

## 2019-01-12 ENCOUNTER — Encounter: Payer: Self-pay | Admitting: Family Medicine

## 2019-01-12 ENCOUNTER — Ambulatory Visit (INDEPENDENT_AMBULATORY_CARE_PROVIDER_SITE_OTHER): Payer: Medicaid Other | Admitting: Family Medicine

## 2019-01-12 ENCOUNTER — Other Ambulatory Visit: Payer: Self-pay

## 2019-01-12 DIAGNOSIS — F411 Generalized anxiety disorder: Secondary | ICD-10-CM | POA: Diagnosis not present

## 2019-01-12 DIAGNOSIS — F331 Major depressive disorder, recurrent, moderate: Secondary | ICD-10-CM

## 2019-01-12 DIAGNOSIS — F5104 Psychophysiologic insomnia: Secondary | ICD-10-CM | POA: Diagnosis not present

## 2019-01-12 MED ORDER — SERTRALINE HCL 50 MG PO TABS: 50.0000 mg | ORAL_TABLET | Freq: Every day | ORAL | 2 refills | Status: DC

## 2019-01-12 NOTE — Progress Notes (Signed)
Virtual Visit via Telephone The purpose of this virtual visit is to provide medical care while limiting exposure to the novel coronavirus (COVID19) for both patient and office staff.  Consent was obtained for phone visit:  Yes.   Answered questions that patient had about telehealth interaction:  Yes.   I discussed the limitations, risks, security and privacy concerns of performing an evaluation and management service by telephone. I also discussed with the patient that there may be a patient responsible charge related to this service. The patient expressed understanding and agreed to proceed.  Patient Location: Home Provider Location: [facility])  ---------------------------------------------------------------------- Chief Complaint  Patient presents with  . Anxiety    S: Reviewed CMA documentation. I have called patient and gathered additional HPI as follows:  Previous PCP Cassell Smiles, AGPCNP-BC   Major recurrent Depression moderate / Anxiety / Insomnia She reports background history today that she has been on and off anxiety/depression meds for past 5-6 years. Previous visit she was advised to not start new med and only adjust dose. Now patient is interested in new med today. 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 - Describes mixed mood depression and anxiety symptoms, with associated insomnia She has 4 kids, anxiety worsening with family / daily life stressors / COVID19 issues - She admits irritability and agitation now more easily than in past  Other meds tried and failed - Buspirone (made her sick dizzy nausea), Fluoxetine, Effexor. - Was on Hydroxyzine PRN insomnia/anxiety, but off this med now. Difficulty reducing anxiety to fall asleep. Now has issue primarily falling asleep.  Never seen Psychiatry  Denies any high risk travel to areas of  current concern for COVID19. Denies any known or suspected exposure to person with or possibly with COVID19.  Denies any fevers, chills, sweats, body ache, cough, shortness of breath, sinus pain or pressure, headache, abdominal pain, diarrhea  Past Medical History:  Diagnosis Date  . Abnormal Pap smear of cervix    abnormal cells and pos HPV, has had neg after 2016 and 2018 pregnancies  . History of palpitations    during pregnancy - likely patent foramen ovale was only abnormality on echo   Social History   Tobacco Use  . Smoking status: Never Smoker  . Smokeless tobacco: Never Used  Substance Use Topics  . Alcohol use: Not Currently  . Drug use: Never    Current Outpatient Medications:  .  levonorgestrel (MIRENA) 20 MCG/24HR IUD, 1 each by Intrauterine route once., Disp: , Rfl:  .  sertraline (ZOLOFT) 50 MG tablet, Take 1 tablet (50 mg total) by mouth daily., Disp: 30 tablet, Rfl: 2  Depression screen [facility] 2/9 01/12/2019 05/16/2018 04/07/2018  Decreased Interest 3 1 2   Down, Depressed, Hopeless 3 1 2   PHQ - 2 Score 6 2 4   Altered sleeping 2 3 3   Tired, decreased energy 3 3 3   Change in appetite 0 1 1  Feeling bad or failure about yourself  0 1 1  Trouble concentrating 3 3 2   Moving slowly or fidgety/restless 3 3 0  Suicidal thoughts 0 0 0  PHQ-9 Score 17 16 14   Difficult doing work/chores Very difficult Very difficult Somewhat difficult    GAD 7 : Generalized Anxiety Score 01/12/2019 05/16/2018 04/07/2018 03/06/2018  Nervous, Anxious, on Edge 3 3 3 3   Control/stop worrying 2 3 2 2   Worry  too much - different things 3 1 2 2   Trouble relaxing 1 3 3 1   Restless 0 1 2 1   Easily annoyed or irritable 3 3 3 3   Afraid - awful might happen 1 1 1  0  Total GAD 7 Score 13 15 16 12   Anxiety Difficulty Extremely difficult Extremely difficult Very difficult Extremely difficult    -------------------------------------------------------------------------- O: No physical exam performed  due to remote telephone encounter.  Lab results reviewed.  No results found for this or any previous visit (from the past 2160 hour(s)).  -------------------------------------------------------------------------- A&P:  Problem List Items Addressed This Visit    Psychophysiological insomnia   Relevant Medications   sertraline (ZOLOFT) 50 MG tablet   GAD (generalized anxiety disorder) - Primary   Relevant Medications   sertraline (ZOLOFT) 50 MG tablet    Other Visit Diagnoses    Moderate episode of recurrent major depressive disorder (HCC)       Relevant Medications   sertraline (ZOLOFT) 50 MG tablet     Clinically with mixed mental health comorbid problems - Major depression recurrent moderate with GAD anxiety and Insomnia.  Failed: Effexor, Duloxetine, Buspar, Hydroxyzine, Fluoxetine  Plan Start NEW SSRI today - Sertraline (generic Zoloft) start 50mg  daily new rx sent, with instructions given to consider dose titrate up to 1.5 = 75mg  or 2 x = 100mg  daily after 3-4 weeks if needed,  counseling on potential side effects risks, reviewed possible GI intolerance, insomnia (although likely to improve this given anxiety likely source of insomnia), reviewed black box warning inc suicidal (no prior history, unlikely concern) - anticipate 4-6 weeks for notable effect - DC duloxetine/Hydroxyzine - Future can consider add on such as Wellbutrin or Trazodone if indicated based on her response and symptoms - Future offer therapist referral or other counseling options.  Meds ordered this encounter  Medications  . sertraline (ZOLOFT) 50 MG tablet    Sig: Take 1 tablet (50 mg total) by mouth daily.    Dispense:  30 tablet    Refill:  2    Follow-up: - Return in 3 months for mood/anxiety med adjust w/ new provider  Patient verbalizes understanding with the above medical recommendations including the limitation of remote medical advice.  Specific follow-up and call-back criteria were given  for patient to follow-up or seek medical care more urgently if needed.   - Time spent in direct consultation with patient on phone: 11 minutes   , DO [facility] 01/12/2019, 3:01 PM

## 2019-01-12 NOTE — Patient Instructions (Addendum)
As discussed, it sounds like your symptoms are primarily related to underlying depression and mixed anxiety  Start treatment with Sertraline (generic Zoloft) 50mg , take 1 tab daily for now - refills added to rx for up to 3 months. It may take up to 3-4 weeks for the medicine to take full effect and for you to notice a difference, sometimes you may notice it working sooner, otherwise we may need to adjust the dose.  For most patients with anxiety or mood concerns, we generally recommend referral to establish with a therapist or counselor as well. This has been shown to improve the effectiveness of the medications, and in the future we may be able to taper off medications.   In future can consider add on medicines - Bupropion (Wellbutrin), Trazodone or other meds  Please schedule a Follow-up Appointment to: Return in about 3 months (around 04/12/2019) for 3 months for mood/anxiety med adjust w/ new provider.  If you have any other questions or concerns, please feel free to call the office or send a message through [place]. You may also schedule an earlier appointment if necessary.  Additionally, you may be receiving a survey about your experience at our office within a few days to 1 week by e-mail or mail. We value your feedback.  Nobie Putnam, DO [place]

## 2019-03-27 ENCOUNTER — Encounter: Payer: Self-pay | Admitting: Obstetrics and Gynecology

## 2019-03-27 ENCOUNTER — Ambulatory Visit (INDEPENDENT_AMBULATORY_CARE_PROVIDER_SITE_OTHER): Payer: Medicaid Other | Admitting: Obstetrics and Gynecology

## 2019-03-27 ENCOUNTER — Other Ambulatory Visit (HOSPITAL_COMMUNITY)
Admission: RE | Admit: 2019-03-27 | Discharge: 2019-03-27 | Disposition: A | Discharge status: Home / Self Care | Payer: Medicaid Other | Source: Ambulatory Visit | Attending: Obstetrics and Gynecology | Admitting: Obstetrics and Gynecology

## 2019-03-27 ENCOUNTER — Other Ambulatory Visit: Payer: Self-pay

## 2019-03-27 VITALS — BP 112/78 | HR 84 | Ht 64.0 in | Wt 191.0 lb

## 2019-03-27 DIAGNOSIS — Z30431 Encounter for routine checking of intrauterine contraceptive device: Secondary | ICD-10-CM | POA: Diagnosis not present

## 2019-03-27 DIAGNOSIS — Z124 Encounter for screening for malignant neoplasm of cervix: Secondary | ICD-10-CM | POA: Insufficient documentation

## 2019-03-27 DIAGNOSIS — B373 Candidiasis of vulva and vagina: Secondary | ICD-10-CM | POA: Diagnosis not present

## 2019-03-27 DIAGNOSIS — B3731 Acute candidiasis of vulva and vagina: Secondary | ICD-10-CM

## 2019-03-27 MED ORDER — FLUCONAZOLE 150 MG PO TABS: 150.0000 mg | ORAL_TABLET | Freq: Once | ORAL | 0 refills | Status: AC

## 2019-03-27 NOTE — Progress Notes (Signed)
Obstetrics & Gynecology Office Visit   Chief Complaint:  Chief Complaint  Patient presents with  . IUD check    MIrena x 2.5 years, can not find strings  . Vaginitis    History of Present Illness: 29 y.o. patient presenting for follow up of Mirena IUD placement 2 1/2 years ago ago.  The indication for her IUD was contraception.  She denies any complications since her IUD placement.  Still having some occasional spotting.  is able to feel strings.  Not intending for pregnancy in the near future, relocated from [place].  Has 4 children but current partner has none so not ruling out the possibility of future pregnancies.  Her last pap was about 3 years ago.  Review of Systems: Review of Systems  Constitutional: Negative.   Gastrointestinal: Negative.   Genitourinary: Negative.     Past Medical History:  Past Medical History:  Diagnosis Date  . Abnormal Pap smear of cervix    abnormal cells and pos HPV, has had neg after 2016 and 2018 pregnancies  . History of palpitations    during pregnancy - likely patent foramen ovale was only abnormality on echo    Past Surgical History:  History reviewed. No pertinent surgical history.  Gynecologic History: No LMP recorded. (Menstrual status: IUD).  Obstetric History: No obstetric history on file.  Family History:  Family History  Problem Relation Age of Onset  . Ovarian cancer Mother   . Skin cancer Father   . Heart attack Father   . Heart failure Father   . Hypertension Father   . Ovarian cysts Sister   . Breast cancer Maternal Grandmother   . Diabetes Paternal Grandmother   . Skin cancer Paternal Grandfather   . Diabetes Paternal Grandfather     Social History:  Social History   Socioeconomic History  . Marital status: Married    Spouse name: Not on file  . Number of children: Not on file  . Years of education: Not on file  . Highest education level: Not on file  Occupational History  . Not on file  Tobacco Use   . Smoking status: Never Smoker  . Smokeless tobacco: Never Used  Substance and Sexual Activity  . Alcohol use: Not Currently  . Drug use: Never  . Sexual activity: Yes    Birth control/protection: I.U.D.  Other Topics Concern  . Not on file  Social History Narrative  . Not on file   Social Determinants of Health   Financial Resource Strain:   . Difficulty of Paying Living Expenses: Not on file  Food Insecurity:   . Worried About Programme researcher, broadcasting/film/video in the Last Year: Not on file  . Ran Out of Food in the Last Year: Not on file  Transportation Needs:   . Lack of Transportation (Medical): Not on file  . Lack of Transportation (Non-Medical): Not on file  Physical Activity:   . Days of Exercise per Week: Not on file  . Minutes of Exercise per Session: Not on file  Stress:   . Feeling of Stress : Not on file  Social Connections:   . Frequency of Communication with Friends and Family: Not on file  . Frequency of Social Gatherings with Friends and Family: Not on file  . Attends Religious Services: Not on file  . Active Member of Clubs or Organizations: Not on file  . Attends Banker Meetings: Not on file  . Marital Status: Not on  file  Intimate Partner Violence:   . Fear of Current or Ex-Partner: Not on file  . Emotionally Abused: Not on file  . Physically Abused: Not on file  . Sexually Abused: Not on file    Allergies:  Allergies  Allergen Reactions  . Buspirone Nausea And Vomiting    Medications: Prior to Admission medications   Medication Sig Start Date End Date Taking? Authorizing Provider  levonorgestrel (MIRENA) 20 MCG/24HR IUD 1 each by Intrauterine route once.   Yes [provider]  sertraline (ZOLOFT) 50 MG tablet Take 1 tablet (50 mg total) by mouth daily. 01/12/19  Yes Karamalegos, Devonne Doughty, DO  fluconazole (DIFLUCAN) 150 MG tablet Take 1 tablet (150 mg total) by mouth once for 1 dose. Can take additional dose three days later if  symptoms persist 03/27/19 03/27/19  Malachy Mood, MD    Physical Exam Blood pressure 112/78, pulse 84, height 5\' 4"  (1.626 m), weight 191 lb (86.6 kg). No LMP recorded. (Menstrual status: IUD).  General: NAD, well nourished, appears stated age 77: normocephalic, anicteric Pulmonary: No increased work of breathing Genitourinary:  External: Normal external female genitalia.  Normal urethral meatus, normal  Bartholin's and Skene's glands.    Vagina: Normal vaginal mucosa, no evidence of prolapse.    Cervix: Grossly normal in appearance, no bleeding, IUD strings visualized 2cm  Uterus: Non-enlarged, mobile, normal contour.  No CMT  Adnexa: ovaries non-enlarged, no adnexal masses  Rectal: deferred  Lymphatic: no evidence of inguinal lymphadenopathy Extremities: no edema, erythema, or tenderness Neurologic: Grossly intact Psychiatric: mood appropriate, affect full  Female chaperone present for pelvic and breast  portions of the physical exam  Assessment: 29 y.o. follow up IUD  Plan: Problem List Items Addressed This Visit    None    Visit Diagnoses    IUD check up    -  Primary   Screening for malignant neoplasm of cervix       Relevant Orders   Cytology - PAP   Candida vaginitis       Relevant Medications   fluconazole (DIFLUCAN) 150 MG tablet       1.  The patient was given instructions to check her IUD strings monthly and call with any problems or concerns.  She should call for fevers, chills, abnormal vaginal discharge, pelvic pain, or other complaints.  2.   IUDs while effective at preventing pregnancy do not prevent transmission of sexually transmitted diseases and use of barrier methods for this purpose was discussed.  Low overall incidence of failure with 99.7% efficacy rate in typical use.  The patient has not contraindication to IUD placement.  3.  She will return for a annual exam in 1 year.  All questions answered.  4) A total of 15 minutes were spent in  face-to-face contact with the patient during this encounter with over half of that time devoted to counseling and coordination of care.  5) Return in about 1 year (around 03/26/2020) for annual.   Malachy Mood, MD, [facility], [facility] 03/27/2019, 7:26 PM  Strings in good position Rx diflucan cancdia Pap updated IUD placed 2019 in [place]

## 2019-03-31 LAB — CYTOLOGY - PAP: Diagnosis: NEGATIVE

## 2019-04-14 ENCOUNTER — Other Ambulatory Visit: Payer: Self-pay

## 2019-04-14 ENCOUNTER — Ambulatory Visit (INDEPENDENT_AMBULATORY_CARE_PROVIDER_SITE_OTHER): Payer: Medicaid Other | Admitting: Family Medicine

## 2019-04-14 ENCOUNTER — Encounter: Payer: Self-pay | Admitting: Family Medicine

## 2019-04-14 DIAGNOSIS — F411 Generalized anxiety disorder: Secondary | ICD-10-CM | POA: Diagnosis not present

## 2019-04-14 DIAGNOSIS — F331 Major depressive disorder, recurrent, moderate: Secondary | ICD-10-CM | POA: Diagnosis not present

## 2019-04-14 MED ORDER — SERTRALINE HCL 100 MG PO TABS: 100.0000 mg | ORAL_TABLET | Freq: Every day | ORAL | 3 refills | Status: DC

## 2019-04-14 NOTE — Progress Notes (Signed)
Virtual Visit via Telephone The purpose of this virtual visit is to provide medical care while limiting exposure to the novel coronavirus (COVID19) for both patient and office staff.  Consent was obtained for phone visit:  Yes.   Answered questions that patient had about telehealth interaction:  Yes.   I discussed the limitations, risks, security and privacy concerns of performing an evaluation and management service by telephone. I also discussed with the patient that there may be a patient responsible charge related to this service. The patient expressed understanding and agreed to proceed.  Patient Location: Home Provider Location: [facility])  ---------------------------------------------------------------------- Chief Complaint  Patient presents with  . Anxiety    S: Reviewed CMA documentation. I have called patient and gathered additional HPI as follows:  Tiffany Rhodes has continued to take the sertraline 50mg  daily and reports that some aspects of her anxiety have improved, finds that she is still short tempered, has many obligations between going to school, working full time and raising 4 children, reports that the sound of her children playing angers her.  Requesting an increase on sertraline from 50mg  to 100mg  daily.  Offered and declined referral for counseling.  Denies any high risk travel to areas of current concern for COVID19. Denies any known or suspected exposure to person with or possibly with COVID19.  Denies any fevers, chills, sweats, body ache, cough, shortness of breath, sinus pain or pressure, headache, abdominal pain, diarrhea, SI/HI  Past Medical History:  Diagnosis Date  . Abnormal Pap smear of cervix    abnormal cells and pos HPV, has had neg after 2016 and 2018 pregnancies  . History of palpitations    during pregnancy - likely patent foramen ovale was only abnormality on echo   Social History   Tobacco Use  . Smoking status:  Never Smoker  . Smokeless tobacco: Never Used  Substance Use Topics  . Alcohol use: Not Currently  . Drug use: Never    Current Outpatient Medications:  .  levonorgestrel (MIRENA) 20 MCG/24HR IUD, 1 each by Intrauterine route once., Disp: , Rfl:  .  sertraline (ZOLOFT) 100 MG tablet, Take 1 tablet (100 mg total) by mouth daily., Disp: 30 tablet, Rfl: 3  Depression screen [facility] 2/9 04/14/2019 01/12/2019 05/16/2018  Decreased Interest 3 3 1   Down, Depressed, Hopeless 1 3 1   PHQ - 2 Score 4 6 2   Altered sleeping 3 2 3   Tired, decreased energy 3 3 3   Change in appetite 0 0 1  Feeling bad or failure about yourself  1 0 1  Trouble concentrating 3 3 3   Moving slowly or fidgety/restless 0 3 3  Suicidal thoughts 0 0 0  PHQ-9 Score 14 17 16   Difficult doing work/chores Very difficult Very difficult Very difficult    GAD 7 : Generalized Anxiety Score 04/14/2019 01/12/2019 05/16/2018 04/07/2018  Nervous, Anxious, on Edge 0 3 3 3   Control/stop worrying 0 2 3 2   Worry too much - different things 1 3 1 2   Trouble relaxing 3 1 3 3   Restless 0 0 1 2  Easily annoyed or irritable 3 3 3 3   Afraid - awful might happen 0 1 1 1   Total GAD 7 Score 7 13 15 16   Anxiety Difficulty Somewhat difficult Extremely difficult Extremely difficult Very difficult    -------------------------------------------------------------------------- O: No physical exam performed due to remote telephone encounter.   Recent Results (from the past 2160 hour(s))  Cytology - PAP  Status: None   Collection Time: 03/27/19  2:28 PM  Result Value Ref Range   Adequacy      Satisfactory for evaluation; transformation zone component PRESENT.   Diagnosis      - Negative for intraepithelial lesion or malignancy (NILM)    -------------------------------------------------------------------------- A&P:  Problem List Items Addressed This Visit      Other   GAD (generalized anxiety disorder) - Primary   Relevant Medications    sertraline (ZOLOFT) 100 MG tablet   Moderate episode of recurrent major depressive disorder (HCC)    Symptoms have decreased with sertraline 50mg  daily, still having symptoms of feeling short tempered, noises irritate her, sound of children playing angers her.  Agree to increase sertraline from 50mg  daily to 100mg  daily.  Will see her back in clinic in 4 weeks for re-evaluation, sooner should the need arise.       Relevant Medications   sertraline (ZOLOFT) 100 MG tablet      Meds ordered this encounter  Medications  . sertraline (ZOLOFT) 100 MG tablet    Sig: Take 1 tablet (100 mg total) by mouth daily.    Dispense:  30 tablet    Refill:  3    Follow-up: - Return in 1 months for re-evaluation with sertaline increase from 50mg  to 100mg  and for physical exam  Patient verbalizes understanding with the above medical recommendations including the limitation of remote medical advice.  Specific follow-up and call-back criteria were given for patient to follow-up or seek medical care more urgently if needed.   - Time spent in direct consultation with patient on phone: 7 minutes  Harlin Rain, [facility] 04/14/2019, 3:04 PM

## 2019-04-14 NOTE — Assessment & Plan Note (Signed)
Symptoms have decreased with sertraline 50mg  daily, still having symptoms of feeling short tempered, noises irritate her, sound of children playing angers her.  Agree to increase sertraline from 50mg  daily to 100mg  daily.  Will see her back in clinic in 4 weeks for re-evaluation, sooner should the need arise.

## 2019-04-14 NOTE — Patient Instructions (Addendum)
Will increase your sertraline from 50mg  daily to 100mg  daily.  We will plan to see you in clinic in 4 weeks for your physical and to recheck your anxiety with the medication increase.  If any thoughts of harming yourself or others, to dial 911 or proceed to the nearest emergency room.  The following recommendations are helpful adjuncts for helping rebalance your mood.  Eat a nourishing diet. Ensure adequate intake of calories, protein, carbs, fat, vitamins, and minerals. Prioritize whole foods at each meal, including meats, vegetables, fruits, nuts and seeds, etc.   Avoid inflammatory and/or "junk" foods, such as sugar, omega-6 fats, refined grains, chemicals, and preservatives are common in packaged and prepared foods. Minimize or completely avoid these ingredients and stick to whole foods with little to no additives. Cook from scratch as much as possible for more control over what you eat  Get enough sleep. Poor sleep is significantly associated with depression and anxiety. Make 7-9 hours of sleep nightly a top priority  Exercise appropriately. Exercise is known to improve brain functioning and boost mood. Aim for 30 minutes of daily physical activity. Avoid "overtraining," which can cause mental disturbances  Assess your light exposure. Not enough natural light during the day and too much artificial light can have a major impact on your mood. Get outside as often as possible during daylight hours. Minimize light exposure after dark and avoid the use of electronics that give off blue light before bed  Manage your stress.  Use daily stress management techniques such as meditation, yoga, or mindfulness to retrain your brain to respond differently to stress. Try deep breathing to deactivate your "fight or flight" response.  There are many of sources with apps like Headspace, Calm or a variety of YouTube videos (videos from have guided meditation)  Prioritize your social life. Work  on building social support with new friends or improve current relationships. Consider getting a pet that allows for companionship, social interaction, and physical touch. Try volunteering or joining a faith-based community to increase your sense of purpose  4-7-8 breathing technique at bedtime: breathe in to count of 4, hold breath for count of 7, exhale for count of 8; do 3-5 times for letting go of overactive thoughts  Take time to play Unstructured "play" time can help reduce anxiety and depression Options for play include music, games, sports, dance, art, etc.  Try to add daily omega 3 fatty acids, magnesium, B complex, and balanced amino acid supplements to help improve mood and anxiety.  You will receive a survey after today's visit either digitally by e-mail or paper by . Your experiences and feedback matter to [facility].  Please respond so we know how we are doing as we provide care for you.  Call [employer] with any questions/concerns/needs.  It is my goal to be available to you for your health concerns.  Thanks for choosing me to be a partner in your healthcare needs!  [place], FNP-C Family Nurse Practitioner [facility] Phone: [phone]

## 2019-04-23 ENCOUNTER — Encounter: Payer: Self-pay | Admitting: Family Medicine

## 2019-05-06 ENCOUNTER — Ambulatory Visit: Payer: Medicaid Other | Admitting: Family Medicine

## 2019-05-07 ENCOUNTER — Encounter: Payer: Self-pay | Admitting: Family Medicine

## 2019-05-07 ENCOUNTER — Other Ambulatory Visit: Payer: Self-pay

## 2019-05-07 ENCOUNTER — Ambulatory Visit (INDEPENDENT_AMBULATORY_CARE_PROVIDER_SITE_OTHER): Payer: Medicaid Other | Admitting: Family Medicine

## 2019-05-07 DIAGNOSIS — F411 Generalized anxiety disorder: Secondary | ICD-10-CM | POA: Diagnosis not present

## 2019-05-07 MED ORDER — SERTRALINE HCL 100 MG PO TABS: 150.0000 mg | ORAL_TABLET | Freq: Every day | ORAL | 1 refills | Status: DC

## 2019-05-07 NOTE — Assessment & Plan Note (Signed)
Had met with Tiffany Rhodes last visit for an increase in her sertraline from 64m to 1076mdaily.  Reports she believed she had 2557mablets on hand and had increased to 48m68mere she actually had 48mg64mlets on hand and has been taking 100mg 59my without improvement in symptoms.  Discussed we have room to increase to 200mg d33m, but we can increase to 148mg da27mas of today and can re-evaluate in 2-4 weeks for any changes in symptoms.  Discussed to call back sooner for any questions/concerns/needs.

## 2019-05-07 NOTE — Patient Instructions (Signed)
Increase in sertraline today from 100mg  to 150mg  daily.  Will re-evaluate in 2-4 weeks for symptom improvement.   To call sooner with any questions/concerns/needs.  You will receive a survey after today's visit either digitally by e-mail or paper by . Your experiences and feedback matter to .  Please respond so we know how we are doing as we provide care for you.  Call [employer] with any questions/concerns/needs.  It is my goal to be available to you for your health concerns.  Thanks for choosing me to be a partner in your healthcare needs!  [place], FNP-C Family Nurse Practitioner [facility] Phone: [phone]

## 2019-05-07 NOTE — Progress Notes (Signed)
Virtual Visit via Telephone  The purpose of this virtual visit is to provide medical care while limiting exposure to the novel coronavirus (COVID19) for both patient and office staff.  Consent was obtained for phone visit:  Yes.   Answered questions that patient had about telehealth interaction:  Yes.   I discussed the limitations, risks, security and privacy concerns of performing an evaluation and management service by telephone. I also discussed with the patient that there may be a patient responsible charge related to this service. The patient expressed understanding and agreed to proceed.  Patient is at home and is accessed via telephone Services are provided by Harlin Rain, FNP-C from [facility])  ---------------------------------------------------------------------- Chief Complaint  Patient presents with  . Anxiety    S: Reviewed CMA documentation. I have called patient and gathered additional HPI as follows:  Ms. Stamour presents for telemedicine visit for her anxiety.  Reports that we had met 2 weeks ago and she had been taking 167m daily instead of the 526mdaily.  Reports no changes in her anxiety and requesting an increase on her sertraline today.  Patient is currently home Denies any high risk travel to areas of current concern for COVID19. Denies any known or suspected exposure to person with or possibly with COVID19.   Denies any fevers, chills, sweats, body ache, cough, shortness of breath, sinus pain or pressure, headache, abdominal pain, diarrhea  Past Medical History:  Diagnosis Date  . Abnormal Pap smear of cervix    abnormal cells and pos HPV, has had neg after 2016 and 2018 pregnancies  . History of palpitations    during pregnancy - likely patent foramen ovale was only abnormality on echo   Social History   Tobacco Use  . Smoking status: Never Smoker  . Smokeless tobacco: Never Used  Substance Use Topics  . Alcohol  use: Not Currently  . Drug use: Never    Current Outpatient Medications:  .  levonorgestrel (MIRENA) 20 MCG/24HR IUD, 1 each by Intrauterine route once., Disp: , Rfl:  .  sertraline (ZOLOFT) 100 MG tablet, Take 1.5 tablets (150 mg total) by mouth daily., Disp: 90 tablet, Rfl: 1  [facility] 05/[facility] 04/14/2019 01/12/2019  Decreased Interest '3 3 3  ' Down, Depressed, Hopeless '1 1 3  ' PHQ - 2 Score '4 4 6  ' Altered sleeping '3 3 2  ' Tired, decreased energy '3 3 3  ' Change in appetite 1 0 0  Feeling bad or failure about yourself  0 1 0  Trouble concentrating '3 3 3  ' Moving slowly or fidgety/restless 1 0 3  Suicidal thoughts 0 0 0  PHQ-9 Score '15 14 17  ' Difficult doing work/chores Extremely dIfficult Very difficult Very difficult    GAD 7 : Generalized Anxiety Score 05/07/2019 04/14/2019 01/12/2019 05/16/2018  Nervous, Anxious, on Edge 1 0 3 3  Control/stop worrying 1 0 2 3  Worry too much - different things '3 1 3 1  ' Trouble relaxing '3 3 1 3  ' Restless 1 0 0 1  Easily annoyed or irritable '3 3 3 3  ' Afraid - awful might happen 0 0 1 1  Total GAD 7 Score '12 7 13 15  ' Anxiety Difficulty Extremely difficult Somewhat difficult Extremely difficult Extremely difficult    -------------------------------------------------------------------------- O: No physical exam performed due to remote telephone encounter.  Physical Exam: Patient remotely monitored without video.  Verbal communication appropriate.  Cognition normal.    Recent Results (from  the past 2160 hour(s))  Cytology - PAP     Status: None   Collection Time: 03/27/19  2:28 PM  Result Value Ref Range   Adequacy      Satisfactory for evaluation; transformation zone component PRESENT.   Diagnosis      - Negative for intraepithelial lesion or malignancy (NILM)    -------------------------------------------------------------------------- A&P:  Problem List Items Addressed This Visit      Other   GAD (generalized anxiety  disorder)    Had met with Ms. Hank last visit for an increase in her sertraline from 36m to 1063mdaily.  Reports she believed she had 2517mablets on hand and had increased to 1m12mere she actually had 1mg4mlets on hand and has been taking 100mg 42my without improvement in symptoms.  Discussed we have room to increase to 200mg d54m, but we can increase to 11mg da40mas of today and can re-evaluate in 2-4 weeks for any changes in symptoms.  Discussed to call back sooner for any questions/concerns/needs.      Relevant Medications   sertraline (ZOLOFT) 100 MG tablet      Meds ordered this encounter  Medications  . sertraline (ZOLOFT) 100 MG tablet    Sig: Take 1.5 tablets (150 mg total) by mouth daily.    Dispense:  90 tablet    Refill:  1    Follow-up: - Return in 2-4 weeks for re-evaluation and if no change in symptoms will put in referral for psychiatry   Patient verbalizes understanding with the above medical recommendations including the limitation of remote medical advice.  Specific follow-up and call-back criteria were given for patient to follow-up or seek medical care more urgently if needed.   - Time spent in direct consultation with patient on phone: 5 minutes  Dirk Vanaman MHarlin RainSoRockfish/08/2019, 1:24 PM

## 2019-05-26 DIAGNOSIS — Z03818 Encounter for observation for suspected exposure to other biological agents ruled out: Secondary | ICD-10-CM | POA: Diagnosis not present

## 2019-05-26 DIAGNOSIS — Z20828 Contact with and (suspected) exposure to other viral communicable diseases: Secondary | ICD-10-CM | POA: Diagnosis not present

## 2019-06-22 ENCOUNTER — Ambulatory Visit: Payer: Medicaid Other | Admitting: Family Medicine

## 2019-09-11 ENCOUNTER — Ambulatory Visit: Payer: Medicaid Other | Admitting: Family Medicine

## 2019-09-16 ENCOUNTER — Other Ambulatory Visit: Payer: Self-pay

## 2019-09-16 ENCOUNTER — Ambulatory Visit: Payer: Medicaid Other | Admitting: Family Medicine

## 2019-09-18 ENCOUNTER — Ambulatory Visit (INDEPENDENT_AMBULATORY_CARE_PROVIDER_SITE_OTHER): Payer: Medicaid Other | Admitting: Family Medicine

## 2019-09-18 ENCOUNTER — Other Ambulatory Visit: Payer: Self-pay

## 2019-09-18 ENCOUNTER — Encounter: Payer: Self-pay | Admitting: Family Medicine

## 2019-09-18 VITALS — BP 92/49 | HR 90 | Temp 99.1°F | Resp 18 | Ht 64.0 in | Wt 174.4 lb

## 2019-09-18 DIAGNOSIS — F411 Generalized anxiety disorder: Secondary | ICD-10-CM | POA: Diagnosis not present

## 2019-09-18 DIAGNOSIS — F331 Major depressive disorder, recurrent, moderate: Secondary | ICD-10-CM

## 2019-09-18 DIAGNOSIS — E569 Vitamin deficiency, unspecified: Secondary | ICD-10-CM | POA: Diagnosis not present

## 2019-09-18 DIAGNOSIS — F5104 Psychophysiologic insomnia: Secondary | ICD-10-CM | POA: Diagnosis not present

## 2019-09-18 MED ORDER — ESCITALOPRAM OXALATE 10 MG PO TABS: ORAL_TABLET | ORAL | 0 refills | Status: DC

## 2019-09-18 MED ORDER — TRAZODONE HCL 50 MG PO TABS: 25.0000 mg | ORAL_TABLET | Freq: Every evening | ORAL | 1 refills | Status: DC | PRN

## 2019-09-18 NOTE — Patient Instructions (Signed)
Can take escitalopram 72m daily x 7 days then increase to 254mdaily.  Can take trazodone 25-5065mn hour before bed to help with sleep  The following recommendations are helpful adjuncts for helping rebalance your mood.  Eat a nourishing diet. Ensure adequate intake of calories, protein, carbs, fat, vitamins, and minerals. Prioritize whole foods at each meal, including meats, vegetables, fruits, nuts and seeds, etc.   Avoid inflammatory and/or "junk" foods, such as sugar, omega-6 fats, refined grains, chemicals, and preservatives are common in packaged and prepared foods. Minimize or completely avoid these ingredients and stick to whole foods with little to no additives. Cook from scratch as much as possible for more control over what you eat  Get enough sleep. Poor sleep is significantly associated with depression and anxiety. Make 7-9 hours of sleep nightly a top priority  Exercise appropriately. Exercise is known to improve brain functioning and boost mood. Aim for 30 minutes of daily physical activity. Avoid "overtraining," which can cause mental disturbances  Assess your light exposure. Not enough natural light during the day and too much artificial light can have a major impact on your mood. Get outside as often as possible during daylight hours. Minimize light exposure after dark and avoid the use of electronics that give off blue light before bed  Manage your stress.  Use daily stress management techniques such as meditation, yoga, or mindfulness to retrain your brain to respond differently to stress. Try deep breathing to deactivate your "fight or flight" response.  There are many of sources with apps like Headspace, Calm or a variety of YouTube videos (videos from JasGwynne Edingerve guided meditation)  Prioritize your social life. Work on building social support with new friends or improve current relationships. Consider getting a pet that allows for companionship, social  interaction, and physical touch. Try volunteering or joining a faith-based community to increase your sense of purpose  4-7-8 breathing technique at bedtime: breathe in to count of 4, hold breath for count of 7, exhale for count of 8; do 3-5 times for letting go of overactive thoughts  Take time to play Unstructured "play" time can help reduce anxiety and depression Options for play include music, games, sports, dance, art, etc.  Try to add daily omega 3 fatty acids, magnesium, B complex, and balanced amino acid supplements to help improve mood and anxiety.  Sleep hygiene is the single most effective treatment for sleep issues, but it is hard work.  Tips for a good night's sleep:  -Keep sleep environment comfortable and conducive to sleep -Keep regular sleep schedule 7 nights a week -Avoiding naps during the day -Avoiding going to bed until drowsy and ready to sleep, not trying to sleep, and not watching the clock -Get out of bed if not asleep within 15-20 minutes and returning only when drowsy -Avoiding caffeine, nicotine, alcohol, and other substances that interfere with sleep before bedtime -Take an hour before your set bedtime and start to wind down: bath/shower, no more TV or phone (the blue light can interfere with sleeping), listen to soothing music, or meditation -No TV in your bedroom -Exercising regularly, at least 6 hours before sleep. Yoga and Tai Chi can improve sleep quality  There are a lot of books and apps that may help guide you with any of the following:   -Progressive muscle relaxation (involves methodical tension and relaxation of different Muscle groups throughout body)  Guided imagery  -YouTube - JasGwynne Edingers free videos on YouTube that  can help with meditation and some   Abdominal breathing   Over the counter sleep aid one hour before bed- and gradually wean your use over 2-4 weeks  Some examples are : *Melatonin 5-10 mg *Sleepology (Can find on  [employer]) taken according to packaging directions  There are a few online evidence based online programs, unfortunately they are not free.   Developed by a sleep expert who created a drug-free program for insomnia proven more effective than sleeping pills.  www.cbtforinsomnia.com Sleepio is an evidence-based digital sleep improvement program   www.sleepio.com SHUTi is designed to actively help retrain your body and mind for great sleep through six engaging Cognitive Behavioral Therapy for Insomnia strategy and learning sessions  [email or web address]   We will plan to see you back in 3 weeks for depression follow up visit  You will receive a survey after today's visit either digitally by e-mail or paper by [employer]. Your experiences and feedback matter to [place].  Please respond so we know how we are doing as we provide care for you.  Call us with any questions/concerns/needs.  It is my goal to be available to you for your health concerns.  Thanks for choosing me to be a partner in your healthcare needs!  Harlin Rain, FNP-C Family Nurse Practitioner [facility] Phone: [phone]

## 2019-09-18 NOTE — Assessment & Plan Note (Signed)
Having difficulty with falling asleep.  Had previously taken hydroxyzine 10mg  & 25mg  but had daytime sedation the following day.  Is interested in another prescription that may have less daytime sedation.  Sleep hygiene handout provided.  Plan: 1. Can take trazodone 25-50mg , 1 hour before bed, to help with sleep 2. Review sleep hygiene handout 3. RTC in 3 weeks

## 2019-09-18 NOTE — Assessment & Plan Note (Signed)
See Major Depressive Disorder A/P 

## 2019-09-18 NOTE — Progress Notes (Signed)
Subjective:    Patient ID: Tiffany Rhodes, female    DOB: 06/06/1990, 29 y.o.   MRN: [id number]  Tiffany Rhodes is a 29 y.o. female presenting on 09/18/2019 for Anxiety   HPI  Ms. Augspurger presents to clinic for evaluation of anxiety/depression.  Reports she was previously taking sertraline and has recently weaned herself off of the medication, as was not having therapeutic control of her symptoms.  Has been off medications for approx > 1 month.  Depression screen [facility] 2/9 05/07/2019 04/14/2019 01/12/2019  Decreased Interest 3 3 3   Down, Depressed, Hopeless 1 1 3   PHQ - 2 Score 4 4 6   Altered sleeping 3 3 2   Tired, decreased energy 3 3 3   Change in appetite 1 0 0  Feeling bad or failure about yourself  0 1 0  Trouble concentrating 3 3 3   Moving slowly or fidgety/restless 1 0 3  Suicidal thoughts 0 0 0  PHQ-9 Score 15 14 17   Difficult doing work/chores Extremely dIfficult Very difficult Very difficult    Social History   Tobacco Use  . Smoking status: Never Smoker  . Smokeless tobacco: Never Used  Vaping Use  . Vaping Use: Never used  Substance Use Topics  . Alcohol use: Yes    Comment: occasional  . Drug use: Never    Review of Systems  Constitutional: Negative.   HENT: Negative.   Eyes: Negative.   Respiratory: Negative.   Cardiovascular: Negative.   Gastrointestinal: Negative.   Endocrine: Negative.   Genitourinary: Negative.   Musculoskeletal: Negative.   Skin: Negative.   Allergic/Immunologic: Negative.   Neurological: Negative.   Hematological: Negative.   Psychiatric/Behavioral: Positive for dysphoric mood and sleep disturbance. Negative for agitation, behavioral problems, confusion, decreased concentration, hallucinations, self-injury and suicidal ideas. The patient is nervous/anxious. The patient is not hyperactive.    Per HPI unless specifically indicated above     Objective:    BP (!) 92/49 (BP Location: Right Arm, Patient  Position: Sitting, Cuff Size: Normal)   Pulse 90   Temp 99.1 F (37.3 C) (Oral)   Resp 18   Ht 5\' 4"  (1.626 m)   Wt 174 lb 6.4 oz (79.1 kg)   BMI 29.94 kg/m   Wt Readings from Last 3 Encounters:  09/18/19 174 lb 6.4 oz (79.1 kg)  03/27/19 191 lb (86.6 kg)  05/06/18 255 lb (115.7 kg)    Physical Exam Vitals reviewed.  Constitutional:      General: She is not in acute distress.    Appearance: Normal appearance. She is well-developed, well-groomed and overweight. She is not ill-appearing or toxic-appearing.  HENT:     Head: Normocephalic and atraumatic.     Nose:     Comments: is in place, covering mouth and nose. Eyes:     General: Lids are normal. Vision grossly intact.        Right eye: No discharge.        Left eye: No discharge.     Extraocular Movements: Extraocular movements intact.     Conjunctiva/sclera: Conjunctivae normal.     Pupils: Pupils are equal, round, and reactive to light.  Cardiovascular:     Rate and Rhythm: Normal rate and regular rhythm.     Pulses: Normal pulses.     Heart sounds: Normal heart sounds. No murmur heard.  No friction rub. No gallop.   Pulmonary:     Effort: Pulmonary effort is normal. No respiratory distress.  Breath sounds: Normal breath sounds.  Musculoskeletal:     Right lower leg: No edema.     Left lower leg: No edema.  Skin:    General: Skin is warm and dry.     Capillary Refill: Capillary refill takes less than 2 seconds.  Neurological:     General: No focal deficit present.     Mental Status: She is alert and oriented to person, place, and time.  Psychiatric:        Attention and Perception: Attention and perception normal.        Mood and Affect: Mood is anxious and depressed. Affect is flat.        Speech: Speech normal.        Behavior: Behavior normal. Behavior is cooperative.        Thought Content: Thought content normal.        Cognition and Memory: Cognition and memory normal.        Judgment:  Judgment normal.    Results for orders placed or performed in visit on 03/27/19  Cytology - PAP  Result Value Ref Range   Adequacy      Satisfactory for evaluation; transformation zone component PRESENT.   Diagnosis      - Negative for intraepithelial lesion or malignancy (NILM)      Assessment & Plan:   Problem List Items Addressed This Visit      Other   GAD (generalized anxiety disorder) - Primary    See Major Depressive Disorder A/P      Relevant Medications   escitalopram (LEXAPRO) 10 MG tablet   traZODone (DESYREL) 50 MG tablet   Psychophysiological insomnia    Having difficulty with falling asleep.  Had previously taken hydroxyzine 10mg  & 25mg  but had daytime sedation the following day.  Is interested in another prescription that may have less daytime sedation.  Sleep hygiene handout provided.  Plan: 1. Can take trazodone 25-50mg , 1 hour before bed, to help with sleep 2. Review sleep hygiene handout 3. RTC in 3 weeks      Relevant Medications   traZODone (DESYREL) 50 MG tablet   Moderate episode of recurrent major depressive disorder (HCC)    Patient reports has self weaned from her sertraline 150mg  with increased depression and anxiety.  PHQ9-10/GAD7-13.  Has many stressors and feeling short tempered.  Discussed if not having adequate symptom improvement with trial of escitalopram will need to refer to psychiatry for assistance with medication management.  Failed: Effexor, Duloxetine, Buspar, Hydroxyzine, Fluoxetine and Sertraline  Plan: 1. Begin escitalopram 10mg  daily x 7 days then increase to 20mg  daily 2. Mood handout provided 3. RTC in 3 weeks for re-evaluation      Relevant Medications   escitalopram (LEXAPRO) 10 MG tablet   traZODone (DESYREL) 50 MG tablet      Meds ordered this encounter  Medications  . escitalopram (LEXAPRO) 10 MG tablet    Sig: Take 1 tablet (10 mg total) by mouth daily for 7 days, THEN 2 tablets (20 mg total) daily.     Dispense:  97 tablet    Refill:  0  . traZODone (DESYREL) 50 MG tablet    Sig: Take 0.5-1 tablets (25-50 mg total) by mouth at bedtime as needed for sleep.    Dispense:  30 tablet    Refill:  1      Follow up plan: Return in about 3 weeks (around 10/09/2019) for Depression F/U.   , FNP Family Nurse  Practitioner [facility] 09/18/2019, 4:48 PM

## 2019-09-18 NOTE — Assessment & Plan Note (Signed)
Patient reports has self weaned from her sertraline 150mg  with increased depression and anxiety.  PHQ9-10/GAD7-13.  Has many stressors and feeling short tempered.  Discussed if not having adequate symptom improvement with trial of escitalopram will need to refer to psychiatry for assistance with medication management.  Failed: Effexor, Duloxetine, Buspar, Hydroxyzine, Fluoxetine and Sertraline  Plan: 1. Begin escitalopram 10mg  daily x 7 days then increase to 20mg  daily 2. Mood handout provided 3. RTC in 3 weeks for re-evaluation

## 2019-09-25 ENCOUNTER — Other Ambulatory Visit: Payer: Self-pay

## 2019-09-25 ENCOUNTER — Ambulatory Visit
Admission: EM | Admit: 2019-09-25 | Discharge: 2019-09-25 | Disposition: A | Discharge status: Home / Self Care | Payer: Medicaid Other | Attending: Internal Medicine | Admitting: Internal Medicine

## 2019-09-25 DIAGNOSIS — S60562A Insect bite (nonvenomous) of left hand, initial encounter: Secondary | ICD-10-CM | POA: Diagnosis not present

## 2019-09-25 DIAGNOSIS — L03114 Cellulitis of left upper limb: Secondary | ICD-10-CM | POA: Diagnosis not present

## 2019-09-25 DIAGNOSIS — W57XXXA Bitten or stung by nonvenomous insect and other nonvenomous arthropods, initial encounter: Secondary | ICD-10-CM

## 2019-09-25 MED ORDER — PREDNISONE 10 MG PO TABS: ORAL_TABLET | ORAL | 0 refills | Status: DC

## 2019-09-25 MED ORDER — DOXYCYCLINE HYCLATE 100 MG PO CAPS: 100.0000 mg | ORAL_CAPSULE | Freq: Two times a day (BID) | ORAL | 0 refills | Status: DC

## 2019-09-25 NOTE — ED Provider Notes (Signed)
[facility]    CSN: [id number] Arrival date & time: 09/25/19  1510      History   Chief Complaint Chief Complaint  Patient presents with  . Insect Bite    Left hand    HPI Tiffany Rhodes is a 29 y.o. female. who persesnts with swelling and rendess of L hand. She was cleaning the trash and felt some itching on the dorsal L hand between her knuckles and has ben itching it. Today noticed redness and swelling of this hand. She does not know what bit her, but did not feel a sting.     Past Medical History:  Diagnosis Date  . Abnormal Pap smear of cervix    abnormal cells and pos HPV, has had neg after 2016 and 2018 pregnancies  . History of palpitations    during pregnancy - likely patent foramen ovale was only abnormality on echo    Patient Active Problem List   Diagnosis Date Noted  . Moderate episode of recurrent major depressive disorder (HCC) 04/14/2019  . GAD (generalized anxiety disorder) 03/12/2018  . Psychophysiological insomnia 03/12/2018    History reviewed. No pertinent surgical history.  OB History   No obstetric history on file.      Home Medications    Prior to Admission medications   Medication Sig Start Date End Date Taking? Authorizing Provider  escitalopram (LEXAPRO) 10 MG tablet Take 1 tablet (10 mg total) by mouth daily for 7 days, THEN 2 tablets (20 mg total) daily. 09/18/19 11/09/19 Yes Malfi, Jodelle Gross, FNP  levonorgestrel (MIRENA) 20 MCG/24HR IUD 1 each by Intrauterine route once.   Yes [provider]  traZODone (DESYREL) 50 MG tablet Take 0.5-1 tablets (25-50 mg total) by mouth at bedtime as needed for sleep. 09/18/19  Yes Malfi, Jodelle Gross, FNP  doxycycline (VIBRAMYCIN) 100 MG capsule Take 1 capsule (100 mg total) by mouth 2 (two) times daily. 09/25/19   Rodriguez-Southworth, Nettie Elm, PA-C  predniSONE (DELTASONE) 10 MG tablet One qd x 5 days 09/25/19   Rodriguez-Southworth, Nettie Elm, PA-C  sertraline (ZOLOFT) 100 MG  tablet Take 1.5 tablets (150 mg total) by mouth daily. Patient not taking: Reported on 09/18/2019 05/07/19 09/25/19  Tarri Fuller, FNP    Family History Family History  Problem Relation Age of Onset  . Ovarian cancer Mother   . Skin cancer Father   . Heart attack Father   . Heart failure Father   . Hypertension Father   . Ovarian cysts Sister   . Breast cancer Maternal Grandmother   . Diabetes Paternal Grandmother   . Skin cancer Paternal Grandfather   . Diabetes Paternal Grandfather     Social History Social History   Tobacco Use  . Smoking status: Never Smoker  . Smokeless tobacco: Never Used  Vaping Use  . Vaping Use: Never used  Substance Use Topics  . Alcohol use: Yes    Comment: occasional  . Drug use: Never     Allergies   Buspirone   Review of Systems Review of Systems  Constitutional: Negative for activity change, chills, diaphoresis and fever.  HENT: Negative for congestion.   Respiratory: Negative for cough.   Musculoskeletal: Positive for joint swelling.  Skin: Positive for color change, rash and wound.  Neurological: Negative for numbness.  Hematological: Negative for adenopathy.     Physical Exam Triage Vital Signs ED Triage Vitals  Enc Vitals Group     BP 09/25/19 1516 91/76  Pulse Rate 09/25/19 1516 68     Resp 09/25/19 1516 16     Temp 09/25/19 1516 97.7 F (36.5 C)     Temp Source 09/25/19 1516 Oral     SpO2 09/25/19 1516 100 %     Weight 09/25/19 1517 173 lb (78.5 kg)     Height 09/25/19 1517 5\' 4"  (1.626 m)     Head Circumference --      Peak Flow --      Pain Score 09/25/19 1517 2     Pain Loc --      Pain Edu? --      Excl. in GC? --    No data found.  Updated Vital Signs BP 91/76 (BP Location: Right Arm)   Pulse 68   Temp 97.7 F (36.5 C) (Oral)   Resp 16   Ht 5\' 4"  (1.626 m)   Wt 173 lb (78.5 kg)   SpO2 100%   BMI 29.70 kg/m   Visual Acuity Right Eye Distance:   Left Eye Distance:   Bilateral Distance:      Right Eye Near:   Left Eye Near:    Bilateral Near:     Physical Exam Vitals and nursing note reviewed.  Constitutional:      General: She is not in acute distress.    Appearance: Normal appearance. She is not toxic-appearing.  HENT:     Head: Atraumatic.     Right Ear: External ear normal.     Left Ear: External ear normal.  Eyes:     General: No scleral icterus.    Conjunctiva/sclera: Conjunctivae normal.  Pulmonary:     Effort: Pulmonary effort is normal.  Musculoskeletal:        General: Swelling and tenderness present. Normal range of motion.     Cervical back: Neck supple.  Lymphadenopathy:     Upper Body:     Left upper body: No axillary adenopathy.  Skin:    General: Skin is warm and dry.     Comments: L HAND- has warmth and erythma surrouding the bite site and extends to 2/3 of her dorsal hand with moderate swelling. No streaking noted.   Neurological:     Mental Status: She is alert and oriented to person, place, and time.     Gait: Gait normal.  Psychiatric:        Mood and Affect: Mood normal.        Behavior: Behavior normal.        Thought Content: Thought content normal.        Judgment: Judgment normal.    UC Treatments / Results  Labs (all labs ordered are listed, but only abnormal results are displayed) Labs Reviewed - No data to display  EKG   Radiology No results found.  Procedures Procedures (including critical care time)  Medications Ordered in UC Medications - No data to display  Initial Impression / Assessment and Plan / UC Course  I have reviewed the triage vital signs and the nursing notes. She has unknown insect bite that is causing a local reaction and L hand cellulitis. I placed her on Doxy and Prednisone as noted.    Final Clinical Impressions(s) / UC Diagnoses   Final diagnoses:  Cellulitis of left upper extremity  Insect bite of left hand, initial encounter     Discharge Instructions     Use ice for the swelling  and elevate it when resting  Come back if you get  a red streak going towards your arm.     ED Prescriptions    Medication Sig Dispense Auth. Provider   doxycycline (VIBRAMYCIN) 100 MG capsule Take 1 capsule (100 mg total) by mouth 2 (two) times daily. 20 capsule Rodriguez-Southworth, Nettie Elm, PA-C   predniSONE (DELTASONE) 10 MG tablet One qd x 5 days 15 tablet Rodriguez-Southworth, Nettie Elm, PA-C     PDMP not reviewed this encounter.   Garey Ham, Cordelia Poche 09/25/19 2109

## 2019-09-25 NOTE — Discharge Instructions (Addendum)
Use ice for the swelling and elevate it when resting  Come back if you get  a red streak going towards your arm.

## 2019-09-25 NOTE — ED Triage Notes (Signed)
Patient in today w/ c/o insect bite on her left hand. Patient states the bite occurred yesterday evening. Patient also states she does not know what kind of insect it was.

## 2019-09-28 DIAGNOSIS — Z713 Dietary counseling and surveillance: Secondary | ICD-10-CM | POA: Diagnosis not present

## 2019-09-28 DIAGNOSIS — Z9884 Bariatric surgery status: Secondary | ICD-10-CM | POA: Diagnosis not present

## 2019-10-12 ENCOUNTER — Other Ambulatory Visit: Payer: Self-pay | Admitting: Family Medicine

## 2019-10-12 DIAGNOSIS — F331 Major depressive disorder, recurrent, moderate: Secondary | ICD-10-CM

## 2019-10-12 DIAGNOSIS — F5104 Psychophysiologic insomnia: Secondary | ICD-10-CM

## 2019-10-12 DIAGNOSIS — F411 Generalized anxiety disorder: Secondary | ICD-10-CM

## 2019-10-12 NOTE — Telephone Encounter (Signed)
Requested medication (s) are due for refill today: yes  Requested medication (s) are on the active medication list: yes  Last refill: 09/18/19  Future visit scheduled no  Notes to clinic: Pharmacy needs Dx codes and 90 day rx    Requested Prescriptions  Pending Prescriptions Disp Refills   traZODone (DESYREL) 50 MG tablet [Pharmacy Med Name: TRAZODONE 50 MG TABLET] 90 tablet 1    Sig: Take 0.5-1 tablets (25-50 mg total) by mouth at bedtime as needed for sleep.      Psychiatry: Antidepressants - Serotonin Modulator Passed - 10/12/2019  2:33 PM      Passed - Completed PHQ-2 or PHQ-9 in the last 360 days.      Passed - Valid encounter within last 6 months    Recent Outpatient Visits           3 weeks ago GAD (generalized anxiety disorder)   [facility], Jodelle Gross, FNP   5 months ago GAD (generalized anxiety disorder)   [facility], Jodelle Gross, FNP   6 months ago GAD (generalized anxiety disorder)   [facility], Jodelle Gross, FNP   9 months ago GAD (generalized anxiety disorder)   [facility] Smitty Cords, DO   1 year ago GAD (generalized anxiety disorder)   [facility] Kyung Rudd, Alison Stalling, NP                escitalopram (LEXAPRO) 10 MG tablet [Pharmacy Med Name: ESCITALOPRAM 10 MG TABLET] 180 tablet 1    Sig: Take 1 tablet (10 mg total) by mouth daily for 7 days, THEN 2 tablets (20 mg total) daily.      Psychiatry:  Antidepressants - SSRI Passed - 10/12/2019  2:33 PM      Passed - Completed PHQ-2 or PHQ-9 in the last 360 days.      Passed - Valid encounter within last 6 months    Recent Outpatient Visits           3 weeks ago GAD (generalized anxiety disorder)   [facility], Jodelle Gross, FNP   5 months ago GAD (generalized anxiety disorder)   [facility], Jodelle Gross, FNP   6 months ago GAD (generalized anxiety disorder)    [facility], Jodelle Gross, FNP   9 months ago GAD (generalized anxiety disorder)   [facility] Smitty Cords, DO   1 year ago GAD (generalized anxiety disorder)   [facility] Galen Manila, NP

## 2019-11-16 DIAGNOSIS — Z713 Dietary counseling and surveillance: Secondary | ICD-10-CM | POA: Diagnosis not present

## 2019-11-16 DIAGNOSIS — Z9884 Bariatric surgery status: Secondary | ICD-10-CM | POA: Diagnosis not present

## 2019-11-19 ENCOUNTER — Other Ambulatory Visit: Payer: Self-pay | Admitting: Family Medicine

## 2019-11-19 DIAGNOSIS — F411 Generalized anxiety disorder: Secondary | ICD-10-CM

## 2019-11-19 DIAGNOSIS — F331 Major depressive disorder, recurrent, moderate: Secondary | ICD-10-CM

## 2019-11-20 ENCOUNTER — Other Ambulatory Visit: Payer: Self-pay | Admitting: Family Medicine

## 2019-11-20 ENCOUNTER — Encounter: Payer: Self-pay | Admitting: Family Medicine

## 2019-11-20 DIAGNOSIS — F331 Major depressive disorder, recurrent, moderate: Secondary | ICD-10-CM

## 2019-11-20 DIAGNOSIS — F411 Generalized anxiety disorder: Secondary | ICD-10-CM

## 2019-11-20 MED ORDER — ESCITALOPRAM OXALATE 20 MG PO TABS: 20.0000 mg | ORAL_TABLET | Freq: Every day | ORAL | 0 refills | Status: DC

## 2019-12-04 ENCOUNTER — Other Ambulatory Visit: Payer: Self-pay | Admitting: Family Medicine

## 2019-12-04 DIAGNOSIS — F331 Major depressive disorder, recurrent, moderate: Secondary | ICD-10-CM

## 2019-12-04 DIAGNOSIS — F411 Generalized anxiety disorder: Secondary | ICD-10-CM

## 2019-12-16 ENCOUNTER — Ambulatory Visit (INDEPENDENT_AMBULATORY_CARE_PROVIDER_SITE_OTHER): Payer: Medicaid Other | Admitting: Obstetrics and Gynecology

## 2019-12-16 ENCOUNTER — Other Ambulatory Visit: Payer: Self-pay

## 2019-12-16 ENCOUNTER — Encounter: Payer: Self-pay | Admitting: Obstetrics and Gynecology

## 2019-12-16 VITALS — BP 100/60 | Ht 64.0 in | Wt 181.0 lb

## 2019-12-16 DIAGNOSIS — Z30432 Encounter for removal of intrauterine contraceptive device: Secondary | ICD-10-CM

## 2019-12-16 NOTE — Patient Instructions (Signed)
I value your feedback and entrusting us with your care. If you get a [facility] patient survey, I would appreciate you taking the time to let us know about your experience today. Thank you!  As of January 08, 2019, your lab results will be released to your MyChart immediately, before I even have a chance to see them. Please give me time to review them and contact you if there are any abnormalities. Thank you for your patience.  

## 2019-12-16 NOTE — Progress Notes (Signed)
   Chief Complaint  Patient presents with  . IUD removal    plans to conceive     History of Present Illness:  Tiffany Rhodes is a 29 y.o. that had a Mirena IUD placed approximately 3 years ago. Since that time, she denies dyspareunia, pelvic pain, non-menstrual bleeding, vaginal d/c, heavy bleeding. She would like to do surrogate pregnancy for a friend. Hasn't started PNVs yet.    BP 100/60   Ht 5\' 4"  (1.626 m)   Wt 181 lb (82.1 kg)   BMI 31.07 kg/m   Pelvic exam:  Two IUD strings present seen coming from the cervical os. EGBUS, vaginal vault and cervix: within normal limits  IUD Removal Strings of IUD identified and grasped.  IUD removed without problem with ring forceps.  Pt tolerated this well.  IUD noted to be intact.  Assessment:  Encounter for IUD removal  Start PNVs.   F/u prn.   Shatiqua Heroux B. Donata Reddick, PA-C 12/16/2019 10:55 AM

## 2020-01-05 ENCOUNTER — Other Ambulatory Visit: Payer: Self-pay | Admitting: Family Medicine

## 2020-01-05 DIAGNOSIS — F5104 Psychophysiologic insomnia: Secondary | ICD-10-CM

## 2020-01-17 ENCOUNTER — Other Ambulatory Visit: Payer: Self-pay | Admitting: Family Medicine

## 2020-01-17 DIAGNOSIS — F5104 Psychophysiologic insomnia: Secondary | ICD-10-CM

## 2020-01-17 DIAGNOSIS — F331 Major depressive disorder, recurrent, moderate: Secondary | ICD-10-CM

## 2020-01-17 DIAGNOSIS — F411 Generalized anxiety disorder: Secondary | ICD-10-CM

## 2020-01-17 NOTE — Telephone Encounter (Signed)
Requested Prescriptions  Pending Prescriptions Disp Refills   escitalopram (LEXAPRO) 20 MG tablet [Pharmacy Med Name: ESCITALOPRAM 20 MG TABLET] 30 tablet 0    Sig: TAKE 1 TABLET BY MOUTH EVERY DAY     Psychiatry:  Antidepressants - SSRI Passed - 01/17/2020  2:06 PM      Passed - Completed PHQ-2 or PHQ-9 in the last 360 days      Passed - Valid encounter within last 6 months    Recent Outpatient Visits          4 months ago GAD (generalized anxiety disorder)   [facility], Jodelle Gross, FNP   8 months ago GAD (generalized anxiety disorder)   [facility], Jodelle Gross, FNP   9 months ago GAD (generalized anxiety disorder)   [facility], Jodelle Gross, FNP   1 year ago GAD (generalized anxiety disorder)   [facility] Smitty Cords, DO   1 year ago GAD (generalized anxiety disorder)   [facility] Galen Manila, NP      Future Appointments            In 3 days Malfi, Jodelle Gross, FNP [facility]            traZODone (DESYREL) 50 MG tablet [Pharmacy Med Name: TRAZODONE 50 MG TABLET] 30 tablet 0    Sig: TAKE 0.5-1 TABLETS (25-50 MG TOTAL) BY MOUTH AT BEDTIME AS NEEDED FOR SLEEP.     Psychiatry: Antidepressants - Serotonin Modulator Passed - 01/17/2020  2:06 PM      Passed - Completed PHQ-2 or PHQ-9 in the last 360 days      Passed - Valid encounter within last 6 months    Recent Outpatient Visits          4 months ago GAD (generalized anxiety disorder)   [facility], Jodelle Gross, FNP   8 months ago GAD (generalized anxiety disorder)   [facility], Jodelle Gross, FNP   9 months ago GAD (generalized anxiety disorder)   [facility], Jodelle Gross, FNP   1 year ago GAD (generalized anxiety disorder)   [facility] Smitty Cords, DO   1 year ago GAD (generalized anxiety  disorder)   [facility] Galen Manila, NP      Future Appointments            In 3 days Malfi, Jodelle Gross, FNP [facility]

## 2020-01-20 ENCOUNTER — Encounter: Payer: Self-pay | Admitting: Family Medicine

## 2020-01-20 ENCOUNTER — Other Ambulatory Visit: Payer: Self-pay

## 2020-01-20 ENCOUNTER — Telehealth (INDEPENDENT_AMBULATORY_CARE_PROVIDER_SITE_OTHER): Payer: Medicaid Other | Admitting: Family Medicine

## 2020-01-20 DIAGNOSIS — F411 Generalized anxiety disorder: Secondary | ICD-10-CM | POA: Diagnosis not present

## 2020-01-20 DIAGNOSIS — F331 Major depressive disorder, recurrent, moderate: Secondary | ICD-10-CM

## 2020-01-20 MED ORDER — ESCITALOPRAM OXALATE 20 MG PO TABS: 20.0000 mg | ORAL_TABLET | Freq: Every day | ORAL | 1 refills | Status: DC

## 2020-01-20 NOTE — Assessment & Plan Note (Signed)
See Major Depressive Disorder A/P 

## 2020-01-20 NOTE — Progress Notes (Signed)
Virtual Visit via Telephone  The purpose of this virtual visit is to provide medical care while limiting exposure to the novel coronavirus (COVID19) for both patient and office staff.  Consent was obtained for phone visit:  Yes.   Answered questions that patient had about telehealth interaction:  Yes.   I discussed the limitations, risks, security and privacy concerns of performing an evaluation and management service by telephone. I also discussed with the patient that there may be a patient responsible charge related to this service. The patient expressed understanding and agreed to proceed.  Patient is at home and is accessed via telephone Services are provided by Charlaine Dalton, FNP-C from [facility])  ---------------------------------------------------------------------- Chief Complaint  Patient presents with  . Anxiety    S: Reviewed CMA documentation. I have called patient and gathered additional HPI as follows:  Tiffany Rhodes presents for virtual visit via telephone for medication refills on her escitalopram.  Reports moderate improvement in anxiety/depression when she was taking the medications, that she has been off of the medications for a few days due to running out of medicine.    Patient is currently home Denies any high risk travel to areas of current concern for COVID19. Denies any known or suspected exposure to person with or possibly with COVID19.  Past Medical History:  Diagnosis Date  . Abnormal Pap smear of cervix    abnormal cells and pos HPV, has had neg after 2016 and 2018 pregnancies  . History of palpitations    during pregnancy - likely patent foramen ovale was only abnormality on echo   Social History   Tobacco Use  . Smoking status: Never Smoker  . Smokeless tobacco: Never Used  Vaping Use  . Vaping Use: Never used  Substance Use Topics  . Alcohol use: Yes    Comment: occasional  . Drug use: Never    Current Outpatient  Medications:  .  traZODone (DESYREL) 50 MG tablet, TAKE 0.5-1 TABLETS (25-50 MG TOTAL) BY MOUTH AT BEDTIME AS NEEDED FOR SLEEP., Disp: 30 tablet, Rfl: 0 .  escitalopram (LEXAPRO) 20 MG tablet, Take 1 tablet (20 mg total) by mouth daily., Disp: 90 tablet, Rfl: 1  Depression screen [facility] 2/9 01/20/2020 05/07/2019 04/14/2019  Decreased Interest 1 3 3   Down, Depressed, Hopeless 0 1 1  PHQ - 2 Score 1 4 4   Altered sleeping 3 3 3   Tired, decreased energy 3 3 3   Change in appetite 1 1 0  Feeling bad or failure about yourself  1 0 1  Trouble concentrating 3 3 3   Moving slowly or fidgety/restless 0 1 0  Suicidal thoughts 0 0 0  PHQ-9 Score 12 15 14   Difficult doing work/chores Somewhat difficult Extremely dIfficult Very difficult    GAD 7 : Generalized Anxiety Score 01/20/2020 05/07/2019 04/14/2019 01/12/2019  Nervous, Anxious, on Edge 3 1 0 3  Control/stop worrying 2 1 0 2  Worry too much - different things 1 3 1 3   Trouble relaxing 3 3 3 1   Restless 1 1 0 0  Easily annoyed or irritable 3 3 3 3   Afraid - awful might happen 0 0 0 1  Total GAD 7 Score 13 12 7 13   Anxiety Difficulty Extremely difficult Extremely difficult Somewhat difficult Extremely difficult    -------------------------------------------------------------------------- O: No physical exam performed due to remote telephone encounter.  Physical Exam: Patient remotely monitored without video.  Verbal communication appropriate.  Cognition normal.  No results found for  this or any previous visit (from the past 2160 hour(s)).  -------------------------------------------------------------------------- A&P:  Problem List Items Addressed This Visit      Other   GAD (generalized anxiety disorder)    See Major Depressive Disorder AP      Relevant Medications   escitalopram (LEXAPRO) 20 MG tablet   Moderate episode of recurrent major depressive disorder (HCC)    PHQ9-12/GAD7-13.  Was recently changed to escitalopram and taking  20mg  daily with moderate improvement in anxiety/depression reported, but has been out of medications for a few days due to not having additional refills.  Interested in restarting on medications.  Reviewed with patient importance of follow up appointments for re-evaluations to ensure medications are renewed.  Patient verbalized understanding.  Will continue escitalopram 20mg  daily.      Relevant Medications   escitalopram (LEXAPRO) 20 MG tablet      Meds ordered this encounter  Medications  . escitalopram (LEXAPRO) 20 MG tablet    Sig: Take 1 tablet (20 mg total) by mouth daily.    Dispense:  90 tablet    Refill:  1    Follow-up: - Return in 6 months for re-evaluation  Patient verbalizes understanding with the above medical recommendations including the limitation of remote medical advice.  Specific follow-up and call-back criteria were given for patient to follow-up or seek medical care more urgently if needed.  - Time spent in direct consultation with patient on phone: 5 minutes  , FNP-[facility] 01/20/2020, 1:55 PM

## 2020-01-20 NOTE — Assessment & Plan Note (Signed)
[id number]/YEL8-59.  Was recently changed to escitalopram and taking 20mg  daily with moderate improvement in anxiety/depression reported, but has been out of medications for a few days due to not having additional refills.  Interested in restarting on medications.  Reviewed with patient importance of follow up appointments for re-evaluations to ensure medications are renewed.  Patient verbalized understanding.  Will continue escitalopram 20mg  daily.

## 2020-01-31 ENCOUNTER — Other Ambulatory Visit: Payer: Self-pay | Admitting: Family Medicine

## 2020-01-31 DIAGNOSIS — F5104 Psychophysiologic insomnia: Secondary | ICD-10-CM

## 2020-02-01 NOTE — Telephone Encounter (Signed)
Requested medication (s) are due for refill today: yes  Requested medication (s) are on the active medication list: yes  Last refill:  01/17/20 #30  Future visit scheduled: no  Notes to clinic:  Patient requesting 90 day supply. Please review if 90 day supply is appropriate.     Requested Prescriptions  Pending Prescriptions Disp Refills   traZODone (DESYREL) 50 MG tablet [Pharmacy Med Name: TRAZODONE 50 MG TABLET] 90 tablet 1    Sig: TAKE 0.5-1 TABLETS BY MOUTH AT BEDTIME AS NEEDED FOR SLEEP.      Psychiatry: Antidepressants - Serotonin Modulator Passed - 01/31/2020 10:41 AM      Passed - Completed PHQ-2 or PHQ-9 in the last 360 days      Passed - Valid encounter within last 6 months    Recent Outpatient Visits           1 week ago GAD (generalized anxiety disorder)   [facility], Jodelle Gross, FNP   4 months ago GAD (generalized anxiety disorder)   [facility], Jodelle Gross, FNP   9 months ago GAD (generalized anxiety disorder)   [facility], Jodelle Gross, FNP   9 months ago GAD (generalized anxiety disorder)   [facility], Jodelle Gross, FNP   1 year ago GAD (generalized anxiety disorder)   [facility] Smitty Cords, DO

## 2020-02-12 ENCOUNTER — Other Ambulatory Visit: Payer: Medicaid Other

## 2020-02-12 DIAGNOSIS — Z20822 Contact with and (suspected) exposure to covid-19: Secondary | ICD-10-CM | POA: Diagnosis not present

## 2020-02-16 LAB — NOVEL CORONAVIRUS, NAA: SARS-CoV-2, NAA: NOT DETECTED

## 2020-04-19 NOTE — Progress Notes (Signed)
Malfi, Jodelle Gross, FNP   Chief Complaint  Patient presents with  . Contraception    Interested in IUD  . STD testing    HPI:      Ms. Tiffany Rhodes is a 30 y.o. [id number] whose LMP was Patient's last menstrual period was 03/29/2020 (approximate)., presents today for IUD consult. Had mirena in the past, removed 11/21 to conceive, but would like another one now. Menses are monthly, lasting 3-4 days, no BTB, no dysmen. She is sex active, no condoms/BC. No pain/bleeding.  Last pap neg 2/21, no HPV DNA done since not age 40. Hx of abn pap/HPV a couple yrs ago. Pap due now. Would also like STD testing.   Past Medical History:  Diagnosis Date  . Abnormal Pap smear of cervix    abnormal cells and pos HPV, has had neg after 2016 and 2018 pregnancies  . History of palpitations    during pregnancy - likely patent foramen ovale was only abnormality on echo    History reviewed. No pertinent surgical history.  Family History  Problem Relation Age of Onset  . Ovarian cancer Mother   . Skin cancer Father   . Heart attack Father   . Heart failure Father   . Hypertension Father   . Ovarian cysts Sister   . Breast cancer Maternal Grandmother   . Diabetes Paternal Grandmother   . Skin cancer Paternal Grandfather   . Diabetes Paternal Grandfather     Social History   Socioeconomic History  . Marital status: Married    Spouse name: Not on file  . Number of children: Not on file  . Years of education: Not on file  . Highest education level: Not on file  Occupational History  . Not on file  Tobacco Use  . Smoking status: Never Smoker  . Smokeless tobacco: Never Used  Vaping Use  . Vaping Use: Never used  Substance and Sexual Activity  . Alcohol use: Yes    Comment: occasional  . Drug use: Never  . Sexual activity: Yes    Birth control/protection: None  Other Topics Concern  . Not on file  Social History Narrative  . Not on file   Social Determinants of Health    Financial Resource Strain: Not on file  Food Insecurity: Not on file  Transportation Needs: Not on file  Physical Activity: Not on file  Stress: Not on file  Social Connections: Not on file  Intimate Partner Violence: Not on file    Outpatient Medications Prior to Visit  Medication Sig Dispense Refill  . escitalopram (LEXAPRO) 20 MG tablet Take 1 tablet (20 mg total) by mouth daily. 90 tablet 1  . traZODone (DESYREL) 50 MG tablet TAKE 0.5-1 TABLETS BY MOUTH AT BEDTIME AS NEEDED FOR SLEEP. 90 tablet 1   No facility-administered medications prior to visit.      ROS:  Review of Systems  Constitutional: Negative for fever.  Gastrointestinal: Negative for blood in stool, constipation, diarrhea, nausea and vomiting.  Genitourinary: Negative for dyspareunia, dysuria, flank pain, frequency, hematuria, urgency, vaginal bleeding, vaginal discharge and vaginal pain.  Musculoskeletal: Negative for back pain.  Skin: Negative for rash.   OBJECTIVE:   Vitals:  BP 100/70   Ht 5\' 4"  (1.626 m)   Wt 182 lb (82.6 kg)   LMP 03/29/2020 (Approximate)   BMI 31.24 kg/m   Physical Exam Vitals reviewed.  Constitutional:      Appearance: She is well-developed.  Pulmonary:  Effort: Pulmonary effort is normal.  Genitourinary:    General: Normal vulva.     Pubic Area: No rash.      Labia:        Right: No rash, tenderness or lesion.        Left: No rash, tenderness or lesion.      Vagina: Normal. No vaginal discharge, erythema or tenderness.     Cervix: Normal.     Uterus: Normal. Not enlarged and not tender.      Adnexa: Right adnexa normal and left adnexa normal.       Right: No mass or tenderness.         Left: No mass or tenderness.    Musculoskeletal:        General: Normal range of motion.     Cervical back: Normal range of motion.  Skin:    General: Skin is warm and dry.  Neurological:     General: No focal deficit present.     Mental Status: She is alert and oriented to  person, place, and time.  Psychiatric:        Mood and Affect: Mood normal.        Behavior: Behavior normal.        Thought Content: Thought content normal.        Judgment: Judgment normal.     Assessment/Plan: Encounter for initial prescription of intrauterine contraceptive device (IUD)--IUD pros/cons discussed. RTO with menses for Mirena insertion. NSAIDs 1 hr before.   Cervical cancer screening - Plan: Cytology - PAP  Screening for STD (sexually transmitted disease) - Plan: Cytology - PAP  Screening for HPV (human papillomavirus) - Plan: Cytology - PAP     Return if symptoms worsen or fail to improve.  Goldie Tregoning B. Breyer Tejera, PA-C 04/20/2020 11:15 AM

## 2020-04-19 NOTE — Patient Instructions (Signed)
I value your feedback and you entrusting us with your care. If you get a [place] patient survey, I would appreciate you taking the time to let us know about your experience today. Thank you! ? ? ?

## 2020-04-20 ENCOUNTER — Other Ambulatory Visit: Payer: Self-pay

## 2020-04-20 ENCOUNTER — Other Ambulatory Visit (HOSPITAL_COMMUNITY)
Admission: RE | Admit: 2020-04-20 | Discharge: 2020-04-20 | Disposition: A | Discharge status: Home / Self Care | Payer: Medicaid Other | Source: Ambulatory Visit | Attending: Obstetrics and Gynecology | Admitting: Obstetrics and Gynecology

## 2020-04-20 ENCOUNTER — Ambulatory Visit (INDEPENDENT_AMBULATORY_CARE_PROVIDER_SITE_OTHER): Payer: Medicaid Other | Admitting: Obstetrics and Gynecology

## 2020-04-20 ENCOUNTER — Encounter: Payer: Self-pay | Admitting: Obstetrics and Gynecology

## 2020-04-20 VITALS — BP 100/70 | Ht 64.0 in | Wt 182.0 lb

## 2020-04-20 DIAGNOSIS — Z113 Encounter for screening for infections with a predominantly sexual mode of transmission: Secondary | ICD-10-CM

## 2020-04-20 DIAGNOSIS — Z124 Encounter for screening for malignant neoplasm of cervix: Secondary | ICD-10-CM | POA: Diagnosis not present

## 2020-04-20 DIAGNOSIS — Z1151 Encounter for screening for human papillomavirus (HPV): Secondary | ICD-10-CM | POA: Diagnosis not present

## 2020-04-20 DIAGNOSIS — Z30014 Encounter for initial prescription of intrauterine contraceptive device: Secondary | ICD-10-CM | POA: Diagnosis not present

## 2020-04-21 LAB — CYTOLOGY - PAP
Chlamydia: NEGATIVE
Comment: NEGATIVE
Comment: NEGATIVE
Comment: NORMAL
Diagnosis: NEGATIVE
High risk HPV: NEGATIVE
Neisseria Gonorrhea: NEGATIVE

## 2020-04-26 ENCOUNTER — Telehealth: Payer: Self-pay

## 2020-04-26 NOTE — Telephone Encounter (Signed)
Patient is scheduled for Mirena placement for 04/27/20 with ABC at 9:50

## 2020-04-27 NOTE — Progress Notes (Signed)
Tiffany Rhodes, Tiffany Raider, FNP   Chief Complaint  Patient presents with  . Contraception    Mirena insertion    HPI:      Ms. Tiffany Rhodes is a 30 y.o. [id number] whose LMP was Patient's last menstrual period was 04/26/2020 (exact date)., presents today for IUD insertion. Also has LLQ hernia after C/S a few yrs ago. Fairly large but getting more painful. Is reducible, no GI issues.  Pt with FH ovarian cancer in her mom and breast cancer in her MGM. Genetic testing not done. Her mom is deceased.    Past Medical History:  Diagnosis Date  . Abnormal Pap smear of cervix    abnormal cells and pos HPV, has had neg after 2016 and 2018 pregnancies  . Family history of ovarian cancer   . History of palpitations    during pregnancy - likely patent foramen ovale was only abnormality on echo    History reviewed. No pertinent surgical history.  Family History  Problem Relation Age of Onset  . Ovarian cancer Mother 44  . Skin cancer Father        47s  . Heart attack Father   . Heart failure Father   . Hypertension Father   . Ovarian cysts Sister   . Breast cancer Maternal Grandmother 67  . Diabetes Paternal Grandmother   . Skin cancer Paternal Grandfather        49s  . Diabetes Paternal Grandfather     Social History   Socioeconomic History  . Marital status: Married    Spouse name: Not on file  . Number of children: Not on file  . Years of education: Not on file  . Highest education level: Not on file  Occupational History  . Not on file  Tobacco Use  . Smoking status: Never Smoker  . Smokeless tobacco: Never Used  Vaping Use  . Vaping Use: Never used  Substance and Sexual Activity  . Alcohol use: Yes    Comment: occasional  . Drug use: Never  . Sexual activity: Yes    Birth control/protection: None  Other Topics Concern  . Not on file  Social History Narrative  . Not on file   Social Determinants of Health   Financial Resource Strain: Not on file  Food  Insecurity: Not on file  Transportation Needs: Not on file  Physical Activity: Not on file  Stress: Not on file  Social Connections: Not on file  Intimate Partner Violence: Not on file    Outpatient Medications Prior to Visit  Medication Sig Dispense Refill  . escitalopram (LEXAPRO) 20 MG tablet Take 1 tablet (20 mg total) by mouth daily. 90 tablet 1  . traZODone (DESYREL) 50 MG tablet TAKE 0.5-1 TABLETS BY MOUTH AT BEDTIME AS NEEDED FOR SLEEP. 90 tablet 1   No facility-administered medications prior to visit.      ROS:  Review of Systems  Constitutional: Negative for fever.  Gastrointestinal: Negative for blood in stool, constipation, diarrhea, nausea and vomiting.  Genitourinary: Negative for dyspareunia, dysuria, flank pain, frequency, hematuria, urgency, vaginal bleeding, vaginal discharge and vaginal pain.  Musculoskeletal: Negative for back pain.  Skin: Negative for rash.   BREAST: No symptoms   OBJECTIVE:   Vitals:  BP 104/68   Ht _0  (1.626 m)   Wt 186 lb (84.4 kg)   LMP 04/26/2020 (Exact Date)   BMI 31.93 kg/m   Physical Exam Abdominal:     Palpations: Abdomen is  soft.     Hernia: A hernia is present. Hernia is present in the ventral area.        IUD Insertion Procedure Note Patient identified, informed consent performed, consent signed.   Discussed risks of irregular bleeding, cramping, infection, malpositioning or misplacement of the IUD outside the uterus which may require further procedure such as laparoscopy, risk of failure <1%. Time out was performed.    Speculum placed in the vagina.  Cervix visualized.  Cleaned with Betadine x 2.  Grasped anteriorly with a single tooth tenaculum.  Uterus sounded to 9.5 cm.   IUD placed per manufacturer's recommendations.  Strings trimmed to 3 cm. Tenaculum was removed, good hemostasis noted.  Patient tolerated procedure well.   Assessment/Plan: Ventral hernia without obstruction or gangrene - Plan: Ambulatory  referral to General Surgery; refer to gen surg for mgmt.  Family history of ovarian cancer - Plan: Integrated [facility] ([place]); MyRisk testing discussed and done today. Will call with results.   Encounter for IUD insertion - Plan: levonorgestrel (MIRENA, 52 MG,) 20 MCG/24HR IUD   Meds ordered this encounter  Medications  . levonorgestrel (MIRENA, 52 MG,) 20 MCG/24HR IUD    Sig: 1 Intra Uterine Device (1 each total) by Intrauterine route once for 1 dose.    Dispense:  1 Intra Uterine Device    Refill:  0    Order Specific Question:   Supervising Provider    Answer:   Gae Dry [[id number]]     Plan:  Patient was given post-procedure instructions.  She was advised to have backup contraception for one week.   Call if you are having increasing pain, cramps or bleeding or if you have a fever greater than 100.4 degrees F., shaking chills, nausea or vomiting. Patient was also asked to check IUD strings periodically and follow up in 4 weeks for IUD check.  Return in about 4 weeks (around 05/26/2020) for IUD f/u.  Franciso Dierks B. Marquavious Nazar, PA-C 04/28/2020 11:03 AM

## 2020-04-27 NOTE — Patient Instructions (Addendum)
I value your feedback and you entrusting us with your care. If you get a [place] patient survey, I would appreciate you taking the time to let us know about your experience today. Thank you!  [facility] [phone]  Instructions after IUD insertion  Most women experience no significant problems after insertion of an IUD, however minor cramping and spotting for a few days is common. Cramps may be treated with ibuprofen 800mg every 8 hours or Tylenol 650 mg every 4 hours. Contact [facility] immediately if you experience any of the following symptoms during the next week: temperature >99.6 degrees, worsening pelvic pain, abdominal pain, fainting, unusually heavy vaginal bleeding, foul vaginal discharge, or if you think you have expelled the IUD.  Nothing inserted in the vagina for 48 hours. You will be scheduled for a follow up visit in approximately four weeks.  You should check monthly to be sure you can feel the IUD strings in the upper vagina. If you are having a monthly period, try to check after each period. If you cannot feel the IUD strings,  contact [facility] immediately so we can do an exam to determine if the IUD has been expelled.   Please use backup protection until we can confirm the IUD is in place.  Call [facility] if you are exposed to or diagnosed with a sexually transmitted infection, as we will need to discuss whether it is safe for you to continue using an IUD.   

## 2020-04-28 ENCOUNTER — Other Ambulatory Visit: Payer: Self-pay

## 2020-04-28 ENCOUNTER — Encounter: Payer: Self-pay | Admitting: Obstetrics and Gynecology

## 2020-04-28 ENCOUNTER — Ambulatory Visit (INDEPENDENT_AMBULATORY_CARE_PROVIDER_SITE_OTHER): Payer: Medicaid Other | Admitting: Obstetrics and Gynecology

## 2020-04-28 VITALS — BP 104/68 | Ht 64.0 in | Wt 186.0 lb

## 2020-04-28 DIAGNOSIS — Z8041 Family history of malignant neoplasm of ovary: Secondary | ICD-10-CM | POA: Diagnosis not present

## 2020-04-28 DIAGNOSIS — K439 Ventral hernia without obstruction or gangrene: Secondary | ICD-10-CM | POA: Diagnosis not present

## 2020-04-28 DIAGNOSIS — Z30430 Encounter for insertion of intrauterine contraceptive device: Secondary | ICD-10-CM

## 2020-04-28 MED ORDER — MIRENA (52 MG) 20 MCG/24HR IU IUD: 1.0000 | INTRAUTERINE_SYSTEM | Freq: Once | INTRAUTERINE | 0 refills | Status: AC

## 2020-04-29 DIAGNOSIS — Z1371 Encounter for nonprocreative screening for genetic disease carrier status: Secondary | ICD-10-CM

## 2020-04-29 HISTORY — DX: Encounter for nonprocreative screening for genetic disease carrier status: Z13.71

## 2020-05-16 ENCOUNTER — Encounter: Payer: Self-pay | Admitting: Obstetrics and Gynecology

## 2020-05-16 ENCOUNTER — Telehealth: Payer: Self-pay | Admitting: Obstetrics and Gynecology

## 2020-05-16 NOTE — Telephone Encounter (Signed)
Pt aware of neg MyRisk results. IBIS=14.6%/riskscore=12.4%  Patient understands these results only apply to her and her children, and this is not indicative of genetic testing results of her other family members. It is recommended that her other family members have genetic testing done.  Pt also understands negative genetic testing doesn't mean she will never get any of these cancers.   Hard copy mailed to pt. F/u prn.

## 2020-05-26 NOTE — Patient Instructions (Incomplete)
I value your feedback and you entrusting us with your care. If you get a [place] patient survey, I would appreciate you taking the time to let us know about your experience today. Thank you! ? ? ?

## 2020-05-26 NOTE — Progress Notes (Deleted)
   No chief complaint on file.  Ventral hernia repair,  MyRisk neg   History of Present Illness:  Tiffany Rhodes is a 30 y.o. that had a Mirena IUD placed approximately 1 month ago. Since that time, she denies dyspareunia, pelvic pain, non-menstrual bleeding, vaginal d/c, heavy bleeding.   Review of Systems  Physical Exam:  There were no vitals taken for this visit. There is no height or weight on file to calculate BMI.  Pelvic exam:  Two IUD strings {DESC; PRESENT/ABSENT:17923::"present"} seen coming from the cervical os. EGBUS, vaginal vault and cervix: within normal limits   Assessment:   No diagnosis found.  IUD strings present in proper location; pt doing well  Plan: F/u if any signs of infection or can no longer feel the strings.   Tequila Rottmann B. Miner Koral, PA-C 05/26/2020 4:06 PM

## 2020-05-27 ENCOUNTER — Ambulatory Visit: Payer: Medicaid Other | Admitting: Obstetrics and Gynecology

## 2020-05-27 DIAGNOSIS — Z30431 Encounter for routine checking of intrauterine contraceptive device: Secondary | ICD-10-CM

## 2020-06-14 ENCOUNTER — Other Ambulatory Visit: Payer: Self-pay | Admitting: General Surgery

## 2020-06-14 DIAGNOSIS — K439 Ventral hernia without obstruction or gangrene: Secondary | ICD-10-CM

## 2020-06-15 ENCOUNTER — Other Ambulatory Visit: Payer: Self-pay | Admitting: General Surgery
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
## 2020-06-22 ENCOUNTER — Other Ambulatory Visit: Payer: Self-pay

## 2020-06-22 ENCOUNTER — Encounter
Admission: RE | Admit: 2020-06-22 | Discharge: 2020-06-22 | Disposition: A | Discharge status: Home / Self Care | Payer: Medicaid Other | Source: Ambulatory Visit | Attending: General Surgery | Admitting: General Surgery

## 2020-06-22 HISTORY — DX: Anxiety disorder, unspecified: F41.9

## 2020-06-22 HISTORY — DX: Depression, unspecified: F32.A

## 2020-06-22 NOTE — Patient Instructions (Signed)
Your procedure is scheduled on: 07/01/20 Report to DAY SURGERY DEPARTMENT LOCATED ON 2ND FLOOR MEDICAL MALL ENTRANCE. To find out your arrival time please call [phone] between 1PM - 3PM on 06/30/20.  Remember: Instructions that are not followed completely may result in serious medical risk, up to and including death, or upon the discretion of your surgeon and anesthesiologist your surgery may need to be rescheduled.     _X__ 1. Do not eat food after midnight the night before your procedure.                 No gum chewing or hard candies. You may drink clear liquids up to 2 hours                 before you are scheduled to arrive for your surgery- DO not drink clear                 liquids within 2 hours of the start of your surgery.                 Clear Liquids include:  water, apple juice without pulp, clear carbohydrate                 drink such as Clearfast or Gatorade, Black Coffee or Tea (Do not add                 anything to coffee or tea). Diabetics water only  __X__2.  On the morning of surgery brush your teeth with toothpaste and water, you                 may rinse your mouth with mouthwash if you wish.  Do not swallow any              toothpaste of mouthwash.     _X__ 3.  No Alcohol for 24 hours before or after surgery.   _X__ 4.  Do Not Smoke or use e-cigarettes For 24 Hours Prior to Your Surgery.                 Do not use any chewable tobacco products for at least 6 hours prior to                 surgery.  ____  5.  Bring all medications with you on the day of surgery if instructed.   __X__  6.  Notify your doctor if there is any change in your medical condition      (cold, fever, infections).     Do not wear jewelry, make-up, hairpins, clips or nail polish. Do not wear lotions, powders, or perfumes.  Do not shave 48 hours prior to surgery. Men may shave face and neck. Do not bring valuables to the hospital.    [facility] is not responsible for any belongings or  valuables.  Contacts, dentures/partials or body piercings may not be worn into surgery. Bring a case for your contacts, glasses or hearing aids, a denture cup will be supplied. Leave your suitcase in the car. After surgery it may be brought to your room. For patients admitted to the hospital, discharge time is determined by your treatment team.   Patients discharged the day of surgery will not be allowed to drive home.   Please read over the following fact sheets that you were given:     __X__ Take these medicines the morning of surgery with A SIP OF WATER:  1. escitalopram (LEXAPRO) 20 MG tablet  2.   3.   4.  5.  6.  ____ Fleet Enema (as directed)   ____ Use CHG Soap/SAGE wipes as directed  ____ Use inhalers on the day of surgery  ____ Stop metformin/Janumet/Farxiga 2 days prior to surgery    ____ Take 1/2 of usual insulin dose the night before surgery. No insulin the morning          of surgery.   ____ Stop Blood Thinners Coumadin/Plavix/Xarelto/Pleta/Pradaxa/Eliquis/Effient/Aspirin  on   Or contact your Surgeon, Cardiologist or Medical Doctor regarding  ability to stop your blood thinners  __X__ Stop Anti-inflammatories 7 days before surgery such as Advil, Ibuprofen, Motrin,  BC or Goodies Powder, Naprosyn, Naproxen, Aleve, Aspirin    __X__ Stop all herbal supplements, fish oil or vitamin E until after surgery.    ____ Bring C-Pap to the hospital.     USE DIAL SOAP AND SHOWER THE NIGHT BEFORE AND THE MORNING OF SURGERY.

## 2020-06-24 ENCOUNTER — Other Ambulatory Visit: Payer: Self-pay

## 2020-06-24 ENCOUNTER — Ambulatory Visit
Admission: RE | Admit: 2020-06-24 | Discharge: 2020-06-24 | Disposition: A | Discharge status: Home / Self Care | Payer: Medicaid Other | Source: Ambulatory Visit | Attending: General Surgery | Admitting: General Surgery

## 2020-06-24 DIAGNOSIS — K439 Ventral hernia without obstruction or gangrene: Secondary | ICD-10-CM | POA: Diagnosis not present

## 2020-06-24 DIAGNOSIS — K4090 Unilateral inguinal hernia, without obstruction or gangrene, not specified as recurrent: Secondary | ICD-10-CM | POA: Diagnosis not present

## 2020-06-24 DIAGNOSIS — K449 Diaphragmatic hernia without obstruction or gangrene: Secondary | ICD-10-CM | POA: Diagnosis not present

## 2020-06-24 DIAGNOSIS — R1904 Left lower quadrant abdominal swelling, mass and lump: Secondary | ICD-10-CM | POA: Diagnosis not present

## 2020-07-01 ENCOUNTER — Other Ambulatory Visit: Payer: Self-pay

## 2020-07-01 ENCOUNTER — Ambulatory Visit
Admission: RE | Admit: 2020-07-01 | Discharge: 2020-07-01 | Disposition: A | Discharge status: Home / Self Care | Payer: Medicaid Other | Attending: General Surgery | Admitting: General Surgery

## 2020-07-01 ENCOUNTER — Ambulatory Visit: Payer: Medicaid Other | Admitting: Anesthesiology

## 2020-07-01 ENCOUNTER — Encounter: Payer: Self-pay | Admitting: General Surgery

## 2020-07-01 ENCOUNTER — Encounter
Admission: RE | Disposition: A | Discharge status: Home / Self Care | Payer: Self-pay | Source: Home / Self Care | Attending: General Surgery

## 2020-07-01 DIAGNOSIS — Z885 Allergy status to narcotic agent status: Secondary | ICD-10-CM | POA: Diagnosis not present

## 2020-07-01 DIAGNOSIS — R109 Unspecified abdominal pain: Secondary | ICD-10-CM | POA: Diagnosis not present

## 2020-07-01 DIAGNOSIS — Z793 Long term (current) use of hormonal contraceptives: Secondary | ICD-10-CM | POA: Insufficient documentation

## 2020-07-01 DIAGNOSIS — Z79899 Other long term (current) drug therapy: Secondary | ICD-10-CM | POA: Diagnosis not present

## 2020-07-01 DIAGNOSIS — Z9884 Bariatric surgery status: Secondary | ICD-10-CM | POA: Insufficient documentation

## 2020-07-01 DIAGNOSIS — K439 Ventral hernia without obstruction or gangrene: Secondary | ICD-10-CM | POA: Insufficient documentation

## 2020-07-01 DIAGNOSIS — G8918 Other acute postprocedural pain: Secondary | ICD-10-CM | POA: Diagnosis not present

## 2020-07-01 HISTORY — PX: VENTRAL HERNIA REPAIR: SHX424

## 2020-07-01 LAB — POCT PREGNANCY, URINE: Preg Test, Ur: NEGATIVE

## 2020-07-01 SURGERY — REPAIR, HERNIA, VENTRAL
Anesthesia: General

## 2020-07-01 MED ORDER — MIDAZOLAM HCL 2 MG/2ML IJ SOLN
INTRAMUSCULAR | Status: DC | PRN
  Administered 2020-07-01: 2 mg via INTRAVENOUS

## 2020-07-01 MED ORDER — BACITRACIN ZINC 500 UNIT/GM EX OINT
TOPICAL_OINTMENT | CUTANEOUS | Status: DC | PRN
  Administered 2020-07-01: 1 via TOPICAL

## 2020-07-01 MED ORDER — PROPOFOL 10 MG/ML IV BOLUS
INTRAVENOUS | Status: AC
  Filled 2020-07-01: qty 20

## 2020-07-01 MED ORDER — ACETAMINOPHEN 10 MG/ML IV SOLN
INTRAVENOUS | Status: AC
  Filled 2020-07-01: qty 100

## 2020-07-01 MED ORDER — BUPIVACAINE HCL (PF) 0.5 % IJ SOLN
INTRAMUSCULAR | Status: AC
  Filled 2020-07-01: qty 40

## 2020-07-01 MED ORDER — PROMETHAZINE HCL 25 MG/ML IJ SOLN
INTRAMUSCULAR | Status: AC
  Filled 2020-07-01: qty 1

## 2020-07-01 MED ORDER — ROCURONIUM BROMIDE 100 MG/10ML IV SOLN
INTRAVENOUS | Status: DC | PRN
  Administered 2020-07-01: 50 mg via INTRAVENOUS

## 2020-07-01 MED ORDER — KETOROLAC TROMETHAMINE 30 MG/ML IJ SOLN
INTRAMUSCULAR | Status: DC | PRN
  Administered 2020-07-01: 30 mg via INTRAVENOUS

## 2020-07-01 MED ORDER — MEPERIDINE HCL 25 MG/ML IJ SOLN: 6.2500 mg | INTRAMUSCULAR | Status: DC | PRN

## 2020-07-01 MED ORDER — KETOROLAC TROMETHAMINE 30 MG/ML IJ SOLN
INTRAMUSCULAR | Status: AC
  Filled 2020-07-01: qty 1

## 2020-07-01 MED ORDER — DEXMEDETOMIDINE (PRECEDEX) IN NS 20 MCG/5ML (4 MCG/ML) IV SYRINGE
PREFILLED_SYRINGE | INTRAVENOUS | Status: AC
  Filled 2020-07-01: qty 5

## 2020-07-01 MED ORDER — FENTANYL CITRATE (PF) 100 MCG/2ML IJ SOLN: 25.0000 ug | INTRAMUSCULAR | Status: DC | PRN

## 2020-07-01 MED ORDER — FAMOTIDINE 20 MG PO TABS: 20.0000 mg | ORAL_TABLET | Freq: Once | ORAL | Status: AC

## 2020-07-01 MED ORDER — ONDANSETRON HCL 4 MG/2ML IJ SOLN
INTRAMUSCULAR | Status: AC
  Filled 2020-07-01: qty 2

## 2020-07-01 MED ORDER — ONDANSETRON 4 MG PO TBDP: 4.0000 mg | ORAL_TABLET | Freq: Three times a day (TID) | ORAL | 0 refills | Status: DC | PRN

## 2020-07-01 MED ORDER — BACITRACIN ZINC 500 UNIT/GM EX OINT
TOPICAL_OINTMENT | CUTANEOUS | Status: AC
  Filled 2020-07-01: qty 28.35

## 2020-07-01 MED ORDER — PROMETHAZINE HCL 25 MG/ML IJ SOLN
6.2500 mg | INTRAMUSCULAR | Status: DC | PRN
  Administered 2020-07-01: 12.5 mg via INTRAVENOUS

## 2020-07-01 MED ORDER — MIDAZOLAM HCL 2 MG/2ML IJ SOLN
INTRAMUSCULAR | Status: AC
  Filled 2020-07-01: qty 2

## 2020-07-01 MED ORDER — CHLORHEXIDINE GLUCONATE CLOTH 2 % EX PADS: 6.0000 | MEDICATED_PAD | Freq: Once | CUTANEOUS | Status: DC

## 2020-07-01 MED ORDER — CEFADROXIL 500 MG PO CAPS: 500.0000 mg | ORAL_CAPSULE | Freq: Two times a day (BID) | ORAL | 0 refills | Status: DC

## 2020-07-01 MED ORDER — FENTANYL CITRATE (PF) 100 MCG/2ML IJ SOLN
INTRAMUSCULAR | Status: DC | PRN
  Administered 2020-07-01: 50 ug via INTRAVENOUS

## 2020-07-01 MED ORDER — FAMOTIDINE 20 MG PO TABS
ORAL_TABLET | ORAL | Status: AC
  Administered 2020-07-01: 20 mg via ORAL
  Filled 2020-07-01: qty 1

## 2020-07-01 MED ORDER — ROCURONIUM BROMIDE 10 MG/ML (PF) SYRINGE
PREFILLED_SYRINGE | INTRAVENOUS | Status: AC
  Filled 2020-07-01: qty 10

## 2020-07-01 MED ORDER — LIDOCAINE HCL (CARDIAC) PF 100 MG/5ML IV SOSY
PREFILLED_SYRINGE | INTRAVENOUS | Status: DC | PRN
  Administered 2020-07-01: 80 mg via INTRAVENOUS

## 2020-07-01 MED ORDER — CHLORHEXIDINE GLUCONATE 0.12 % MT SOLN: 15.0000 mL | Freq: Once | OROMUCOSAL | Status: AC

## 2020-07-01 MED ORDER — DEXMEDETOMIDINE (PRECEDEX) IN NS 20 MCG/5ML (4 MCG/ML) IV SYRINGE
PREFILLED_SYRINGE | INTRAVENOUS | Status: DC | PRN
  Administered 2020-07-01: 4 ug via INTRAVENOUS

## 2020-07-01 MED ORDER — ONDANSETRON HCL 4 MG/2ML IJ SOLN
INTRAMUSCULAR | Status: DC | PRN
  Administered 2020-07-01: 4 mg via INTRAVENOUS

## 2020-07-01 MED ORDER — CEFAZOLIN SODIUM-DEXTROSE 2-4 GM/100ML-% IV SOLN
INTRAVENOUS | Status: AC
  Filled 2020-07-01: qty 100

## 2020-07-01 MED ORDER — LACTATED RINGERS IV SOLN: INTRAVENOUS | Status: DC

## 2020-07-01 MED ORDER — SUGAMMADEX SODIUM 200 MG/2ML IV SOLN
INTRAVENOUS | Status: DC | PRN
  Administered 2020-07-01: 200 mg via INTRAVENOUS

## 2020-07-01 MED ORDER — PHENYLEPHRINE HCL (PRESSORS) 10 MG/ML IV SOLN
INTRAVENOUS | Status: DC | PRN
  Administered 2020-07-01 (×2): 50 ug via INTRAVENOUS

## 2020-07-01 MED ORDER — DEXAMETHASONE SODIUM PHOSPHATE 10 MG/ML IJ SOLN
INTRAMUSCULAR | Status: AC
  Filled 2020-07-01: qty 1

## 2020-07-01 MED ORDER — BUPIVACAINE LIPOSOME 1.3 % IJ SUSP
INTRAMUSCULAR | Status: DC | PRN
  Administered 2020-07-01: 20 mL

## 2020-07-01 MED ORDER — CHLORHEXIDINE GLUCONATE 0.12 % MT SOLN
OROMUCOSAL | Status: AC
  Administered 2020-07-01: 15 mL via OROMUCOSAL
  Filled 2020-07-01: qty 15

## 2020-07-01 MED ORDER — BUPIVACAINE-EPINEPHRINE (PF) 0.5% -1:200000 IJ SOLN
INTRAMUSCULAR | Status: AC
  Filled 2020-07-01: qty 30

## 2020-07-01 MED ORDER — BUPIVACAINE LIPOSOME 1.3 % IJ SUSP
INTRAMUSCULAR | Status: AC
  Filled 2020-07-01: qty 20

## 2020-07-01 MED ORDER — BUPIVACAINE HCL (PF) 0.5 % IJ SOLN
INTRAMUSCULAR | Status: DC | PRN
  Administered 2020-07-01: 30 mL

## 2020-07-01 MED ORDER — ACETAMINOPHEN 10 MG/ML IV SOLN
INTRAVENOUS | Status: DC | PRN
  Administered 2020-07-01: 1000 mg via INTRAVENOUS

## 2020-07-01 MED ORDER — FENTANYL CITRATE (PF) 100 MCG/2ML IJ SOLN
INTRAMUSCULAR | Status: AC
  Filled 2020-07-01: qty 2

## 2020-07-01 MED ORDER — PROPOFOL 10 MG/ML IV BOLUS
INTRAVENOUS | Status: DC | PRN
  Administered 2020-07-01: 150 mg via INTRAVENOUS

## 2020-07-01 MED ORDER — EPHEDRINE SULFATE 50 MG/ML IJ SOLN
INTRAMUSCULAR | Status: DC | PRN
  Administered 2020-07-01: 10 mg via INTRAVENOUS

## 2020-07-01 MED ORDER — EPHEDRINE 5 MG/ML INJ
INTRAVENOUS | Status: AC
  Filled 2020-07-01: qty 10

## 2020-07-01 MED ORDER — CEFAZOLIN SODIUM-DEXTROSE 2-4 GM/100ML-% IV SOLN
2.0000 g | INTRAVENOUS | Status: AC
  Administered 2020-07-01: 2 g via INTRAVENOUS

## 2020-07-01 MED ORDER — HYDROCODONE-ACETAMINOPHEN 5-325 MG PO TABS: 1.0000 | ORAL_TABLET | ORAL | 0 refills | Status: DC | PRN

## 2020-07-01 MED ORDER — SODIUM CHLORIDE FLUSH 0.9 % IV SOLN
INTRAVENOUS | Status: AC
  Filled 2020-07-01: qty 10

## 2020-07-01 MED ORDER — ORAL CARE MOUTH RINSE: 15.0000 mL | Freq: Once | OROMUCOSAL | Status: AC

## 2020-07-01 MED ORDER — DEXAMETHASONE SODIUM PHOSPHATE 10 MG/ML IJ SOLN
INTRAMUSCULAR | Status: DC | PRN
  Administered 2020-07-01: 10 mg via INTRAVENOUS

## 2020-07-01 SURGICAL SUPPLY — 46 items
APL PRP STRL LF DISP 70% ISPRP (MISCELLANEOUS) ×1
APL SKNCLS STERI-STRIP NONHPOA (GAUZE/BANDAGES/DRESSINGS) ×1
BENZOIN TINCTURE PRP APPL 2/3 (GAUZE/BANDAGES/DRESSINGS) ×2 IMPLANT
BINDER ABDOMINAL  9 SM 30-45 (SOFTGOODS) ×1
BINDER ABDOMINAL 9 SM 30-45 (SOFTGOODS) ×1 IMPLANT
BLADE SURG 15 STRL SS SAFETY (BLADE) ×2 IMPLANT
BULB RESERV EVAC DRAIN JP 100C (MISCELLANEOUS) ×2 IMPLANT
CANISTER SUCT 1200ML W/VALVE (MISCELLANEOUS) ×2 IMPLANT
CHLORAPREP W/TINT 26 (MISCELLANEOUS) ×2 IMPLANT
COVER WAND RF STERILE (DRAPES) ×2 IMPLANT
DRAIN CHANNEL JP 15F RND 16 (MISCELLANEOUS) ×2 IMPLANT
DRAPE LAPAROTOMY 100X77 ABD (DRAPES) ×2 IMPLANT
DRSG OPSITE POSTOP 4X8 (GAUZE/BANDAGES/DRESSINGS) ×2 IMPLANT
DRSG TEGADERM 4X4.75 (GAUZE/BANDAGES/DRESSINGS) ×2 IMPLANT
DRSG TELFA 3X8 NADH (GAUZE/BANDAGES/DRESSINGS) ×2 IMPLANT
ELECT BLADE 6.5 EXT (BLADE) ×2 IMPLANT
ELECT CAUTERY BLADE 6.4 (BLADE) ×2 IMPLANT
ELECT REM PT RETURN 9FT ADLT (ELECTROSURGICAL) ×2
ELECTRODE REM PT RTRN 9FT ADLT (ELECTROSURGICAL) ×1 IMPLANT
GAUZE SPONGE 4X4 12PLY STRL (GAUZE/BANDAGES/DRESSINGS) ×2 IMPLANT
GLOVE SURG ENC MOIS LTX SZ7.5 (GLOVE) ×2 IMPLANT
GLOVE SURG UNDER LTX SZ8 (GLOVE) ×2 IMPLANT
GOWN STRL REUS W/ TWL LRG LVL3 (GOWN DISPOSABLE) ×2 IMPLANT
GOWN STRL REUS W/TWL LRG LVL3 (GOWN DISPOSABLE) ×4
GRASPER SUT TROCAR 14GX15 (MISCELLANEOUS) ×2 IMPLANT
KIT TURNOVER KIT A (KITS) ×2 IMPLANT
LABEL OR SOLS (LABEL) ×2 IMPLANT
MANIFOLD NEPTUNE II (INSTRUMENTS) ×2 IMPLANT
MESH BARD SOFT 6X6IN (Mesh General) ×2 IMPLANT
NEEDLE HYPO 22GX1.5 SAFETY (NEEDLE) ×2 IMPLANT
NEEDLE HYPO 25X1 1.5 SAFETY (NEEDLE) ×2 IMPLANT
NS IRRIG 500ML POUR BTL (IV SOLUTION) ×2 IMPLANT
PACK BASIN MINOR ARMC (MISCELLANEOUS) ×2 IMPLANT
RETAINER VISCERA MED (MISCELLANEOUS) ×2 IMPLANT
SPONGE DRAIN TRACH 4X4 STRL 2S (GAUZE/BANDAGES/DRESSINGS) ×2 IMPLANT
SPONGE LAP 18X18 RF (DISPOSABLE) ×2 IMPLANT
STRIP CLOSURE SKIN 1/2X4 (GAUZE/BANDAGES/DRESSINGS) ×2 IMPLANT
SUT MAXON ABS #0 GS21 30IN (SUTURE) ×8 IMPLANT
SUT SURGILON 0 BLK (SUTURE) ×2 IMPLANT
SUT VIC AB 2-0 BRD 54 (SUTURE) ×2 IMPLANT
SUT VIC AB 3-0 SH 27 (SUTURE) ×2
SUT VIC AB 3-0 SH 27X BRD (SUTURE) ×1 IMPLANT
SUT VIC AB 4-0 FS2 27 (SUTURE) ×2 IMPLANT
SUT VICRYL+ 3-0 144IN (SUTURE) ×2 IMPLANT
SYR 10ML LL (SYRINGE) ×2 IMPLANT
SYR 3ML LL SCALE MARK (SYRINGE) ×2 IMPLANT

## 2020-07-01 NOTE — Op Note (Signed)
Preoperative diagnosis: Ventral hernia post cesarean section.  Postoperative diagnosis: Same.  Operative procedure: Repair of hypogastric ventral hernia with Bard soft mesh onlay.  Operating surgeon: Donnalee Curry, MD.  Assistant: Haynes Hoehn, RNFA.  Anesthesia: General endotracheal, TAPP block per anesthesia.  Estimated blood loss: 30 cc.  Clinical note: This is a 30 year old woman had previously undergone a cesarean section.  She developed a ventral hernia and while only 4 x 5 cm on CT in the standing position this is a mass the size of at least a softball.  She is felt to be a candidate for repair.  She had lost a significant amount of weight post gastric bypass and the ventral hernia was coming more more symptomatic.  She had had no episodes of obstruction.  The patient had SCD stockings for DVT prevention.  She received Ancef on induction of anesthesia.  Operative note: The abdomen was prepped with ChloraPrep and draped.  A midline incision was made beginning below the umbilicus and then eventually extending down to the top of the Pfannenstiel incision.  The skin was incised sharply and the remaining dissection completed with electrocautery.  The hernia sac which was the size of the navel orange at rest was dissected free from the soft tissues, primarily to the left of the midline.  This was freed circumferentially excised and discarded.  There was a pedicle of omentum that was removed as well which was felt to be at risk for later adhesions.  Hemostasis was with 2-0 Vicryl ties and the omental piece was discarded.  The fascial defect measured 4 cm in maximum width, 5 cm in length.  There was no posterior rectus sheath for placement of retrorectus mesh.  As the scarring from her prior surgery and the hernia sac was significant especially on the left side making dissection of the peritoneum tedious it was elected to close the defect and then put an onlay mesh.  The peritoneum was closed  with a running 2-0 Vicryl suture.  The midline fascia was then closed with interrupted 0 Maxon figure-of-eight sutures.  The rectus space on the left side had been essentially been dissected by the hernia sac.  On the right side the adipose tissue was elevated off the rectus fascia was to allow placement of a 5 x 5.5 inch Bard soft mesh.  This was anchored at the top and bottom of the midline incision with about a 3 cm overlap above and 2 cm overlap below.  Of note, examining the fascia prior to closure there was a second weeks but about a centimeter below the primary hernia site.  The entire area was opened and this was then closed as 1 defect.  The secondary defect was small admitting only the tip of the index finger.  No other defects in the wound were appreciated.  The mesh was anchored to the fascia with interrupted 0 Surgilon sutures.  These were done along the midline incision, around the periphery and then numerous sutures throughout the mesh to the anterior rectus sheath to improve adhesion to minimize seroma formation.  A 15 [place] Blake drain was brought out through the right lateral abdomen and anchored into position with a 3-0 nylon suture.  The adipose layer was closed with 2 layers of 2-0 Vicryl figure-of-eight sutures.  The superficial fat was then approximated with interrupted 3-0 Vicryl sutures.  The skin was closed with a running 4-0 Vicryl subcuticular suture.  Benzoin and Steri-Strips were applied followed by bacitracin ointment and Tegaderm  to the drain site and a honeycomb dressing to the midline wound.  The patient tolerated the procedure well and was taken recovery in stable condition.

## 2020-07-01 NOTE — Anesthesia Preprocedure Evaluation (Signed)
Anesthesia Evaluation  Patient identified by MRN, date of birth, ID band Patient awake    Reviewed: Allergy & Precautions, NPO status , Patient's Chart, lab work & pertinent test results  History of Anesthesia Complications Negative for: history of anesthetic complications  Airway Mallampati: II  TM Distance: >3 FB Neck ROM: Full    Dental no notable dental hx. (+) Teeth Intact   Pulmonary neg pulmonary ROS, neg sleep apnea, neg COPD, Patient abstained from smoking.Not current smoker,    Pulmonary exam normal breath sounds clear to auscultation       Cardiovascular Exercise Tolerance: Good METS(-) hypertension(-) CAD and (-) Past MI negative cardio ROS  (-) dysrhythmias  Rhythm:Regular Rate:Normal - Systolic murmurs    Neuro/Psych PSYCHIATRIC DISORDERS Anxiety Depression negative neurological ROS     GI/Hepatic neg GERD  ,(+)     (-) substance abuse  ,   Endo/Other  neg diabetes  Renal/GU negative Renal ROS     Musculoskeletal   Abdominal   Peds  Hematology   Anesthesia Other Findings Past Medical History: No date: Abnormal Pap smear of cervix     Comment:  abnormal cells and pos HPV, has had neg after 2016 and               2018 pregnancies No date: Anxiety 04/2020: BRCA negative     Comment:  MyRisk neg; IBIS=14.6%/riskscore=12.4% No date: Depression No date: Family history of ovarian cancer     Comment:  mom No date: History of palpitations     Comment:  during pregnancy - likely patent foramen ovale was only               abnormality on echo  Reproductive/Obstetrics                             Anesthesia Physical Anesthesia Plan  ASA: II  Anesthesia Plan: General   Post-op Pain Management:  Regional for Post-op pain   Induction: Intravenous  PONV Risk Score and Plan: 4 or greater and Ondansetron, Dexamethasone and Midazolam  Airway Management Planned: Oral  ETT  Additional Equipment: None  Intra-op Plan:   Post-operative Plan: Extubation in OR  Informed Consent: I have reviewed the patients History and Physical, chart, labs and discussed the procedure including the risks, benefits and alternatives for the proposed anesthesia with the patient or authorized representative who has indicated his/her understanding and acceptance.     Dental advisory given  Plan Discussed with: CRNA and Surgeon  Anesthesia Plan Comments: (Discussed risks of anesthesia with patient, including PONV, sore throat, lip/dental damage. Rare risks discussed as well, such as cardiorespiratory and neurological sequelae.   Discussed post-induction bilateral TAP blocks with exparel to help with post op pain. Discussed risks such as bleeding,infection, nerve damage, intestinal damage, poor or failed block. Patient understands and agrees.)        Anesthesia Quick Evaluation

## 2020-07-01 NOTE — Anesthesia Procedure Notes (Signed)
Procedure Name: Intubation Date/Time: 07/01/2020 11:50 AM Performed by: Elmarie Mainland, CRNA Pre-anesthesia Checklist: Patient identified, Emergency Drugs available, Suction available and Patient being monitored Patient Re-evaluated:Patient Re-evaluated prior to induction Oxygen Delivery Method: Circle system utilized Preoxygenation: Pre-oxygenation with 100% oxygen Induction Type: IV induction Ventilation: Mask ventilation without difficulty Laryngoscope Size: McGraph and 3 Grade View: Grade I Tube type: Oral Tube size: 7.0 mm Number of attempts: 1 Airway Equipment and Method: Stylet,  Oral airway and Video-laryngoscopy Placement Confirmation: ETT inserted through vocal cords under direct vision,  positive ETCO2 and breath sounds checked- equal and bilateral Secured at: 22 cm Tube secured with: Tape Dental Injury: Teeth and Oropharynx as per pre-operative assessment

## 2020-07-01 NOTE — Discharge Instructions (Addendum)
Bupivacaine Liposomal Suspension for Injection  (EXPAREL) -- WEAR GREEN BRACELET FOR 3 DAYS What is this medicine? BUPIVACAINE LIPOSOMAL (bue PIV a kane LIP oh som al) is an anesthetic. It causes loss of feeling in the skin or other tissues. It is used to prevent and to treat pain from some procedures. This medicine may be used for other purposes; ask your health care provider or pharmacist if you have questions. COMMON BRAND NAME(S): EXPAREL What should I tell my health care provider before I take this medicine? They need to know if you have any of these conditions:  G6PD deficiency  heart disease  kidney disease  liver disease  low blood pressure  lung or breathing disease, like asthma  an unusual or allergic reaction to bupivacaine, other medicines, foods, dyes, or preservatives  pregnant or trying to get pregnant  breast-feeding How should I use this medicine? This medicine is injected into the affected area. It is given by a health care provider in a hospital or clinic setting. Talk to your health care provider about the use of this medicine in children. While it may be given to children as young as 6 years for selected conditions, precautions do apply. Overdosage: If you think you have taken too much of this medicine contact a poison control center or emergency room at once. NOTE: This medicine is only for you. Do not share this medicine with others. What if I miss a dose? This does not apply. What may interact with this medicine? This medicine may interact with the following medications:  acetaminophen  certain antibiotics like dapsone, nitrofurantoin, aminosalicylic acid, sulfonamides  certain medicines for seizures like phenobarbital, phenytoin, valproic acid  chloroquine  cyclophosphamide  flutamide  hydroxyurea  ifosfamide  metoclopramide  nitric oxide  nitroglycerin  nitroprusside  nitrous oxide  other local anesthetics like lidocaine,  pramoxine, tetracaine  primaquine  quinine  rasburicase  sulfasalazine This list may not describe all possible interactions. Give your health care provider a list of all the medicines, herbs, non-prescription drugs, or dietary supplements you use. Also tell them if you smoke, drink alcohol, or use illegal drugs. Some items may interact with your medicine. What should I watch for while using this medicine? Your condition will be monitored carefully while you are receiving this medicine. Be careful to avoid injury while the area is numb, and you are not aware of pain. What side effects may I notice from receiving this medicine? Side effects that you should report to your doctor or health care professional as soon as possible:  allergic reactions like skin rash, itching or hives, swelling of the face, lips, or tongue  seizures  signs and symptoms of a dangerous change in heartbeat or heart rhythm like chest pain; dizziness; fast, irregular heartbeat; palpitations; feeling faint or lightheaded; falls; breathing problems  signs and symptoms of methemoglobinemia such as pale, gray, or blue colored skin; headache; fast heartbeat; shortness of breath; feeling faint or lightheaded, falls; tiredness Side effects that usually do not require medical attention (report to your doctor or health care professional if they continue or are bothersome):  anxious  back pain  changes in taste  changes in vision  constipation  dizziness  fever  nausea, vomiting This list may not describe all possible side effects. Call your doctor for medical advice about side effects. You may report side effects to FDA at 1-800-FDA-1088. Where should I keep my medicine? This drug is given in a hospital or clinic and will not  be stored at home. NOTE: This sheet is a summary. It may not cover all possible information. If you have questions about this medicine, talk to your doctor, pharmacist, or health care  provider.  2021 Elsevier/Gold Standard (2019-04-23 12:24:57)     AMBULATORY SURGERY  DISCHARGE INSTRUCTIONS   1) The drugs that you were given will stay in your system until tomorrow so for the next 24 hours you should not:  A) Drive an automobile B) Make any legal decisions C) Drink any alcoholic beverage   2) You may resume regular meals tomorrow.  Today it is better to start with liquids and gradually work up to solid foods.  You may eat anything you prefer, but it is better to start with liquids, then soup and crackers, and gradually work up to solid foods.   3) Please notify your doctor immediately if you have any unusual bleeding, trouble breathing, redness and pain at the surgery site, drainage, fever, or pain not relieved by medication.    4) Additional Instructions:   TYLENOL GIVEN AT THE HOSPITAL AT 1:05 PM   Surgical [facility] Care Surgical drains are used to remove extra fluid that normally builds up in a surgical wound after surgery. A surgical drain helps to heal a surgical wound. Different kinds of surgical drains include:  Active drains. These drains use suction to pull drainage away from the surgical wound. Drainage flows through a tube to a container outside of the body. With these drains, you need to keep the bulb or the drainage container flat (compressed) at all times, except while you empty it. Flattening the bulb or container creates suction.  Passive drains. These drains allow fluid to drain naturally, by gravity. Drainage flows through a tube to a bandage (dressing) or a container outside of the body. Passive drains do not need to be emptied. A drain is placed during surgery. Right after surgery, drainage is usually bright red and a little thicker than water. The drainage may gradually turn yellow or pink and become thinner. It is likely that your health care provider will remove the drain when the drainage stops or when the amount decreases to 1-2 Tbsp  (15-30 mL) during a 24-hour period. Supplies needed:  Tape.  Germ-free cleaning solution (sterile saline).  Cotton swabs.  Split gauze drain sponge: 4 x 4 inches (10 x 10 cm).  Gauze square: 4 x 4 inches (10 x 10 cm). How to care for your surgical drain Care for your drain as told by your health care provider. This is important to help prevent infection. If your drain is placed at your back, or any other hard-to-reach area, ask another person to assist you in performing the following tasks: General care  Keep the skin around the drain dry and covered with a dressing at all times.  Check your drain area every day for signs of infection. Check for: ? Redness, swelling, or pain. ? Pus or a bad smell. ? Cloudy drainage. ? Tenderness or pressure at the drain exit site. Changing the dressing Follow instructions from your health care provider about how to change your dressing. Change your dressing at least once a day. Change it more often if needed to keep the dressing dry. Make sure you: 1. Gather your supplies. 2. Wash your hands with soap and water before you change your dressing. If soap and water are not available, use hand sanitizer. 3. Remove the old dressing. Avoid using scissors to do that. 4. Wash your  hands with soap and water again after removing the old dressing. 5. Use sterile saline to clean your skin around the drain. You may need to use a cotton swab to clean the skin. 6. Place the tube through the slit in a drain sponge. Place the drain sponge so that it covers your wound. 7. Place the gauze square or another drain sponge on top of the drain sponge that is on the wound. Make sure the tube is between those layers. 8. Tape the dressing to your skin. 9. Tape the drainage tube to your skin 1-2 inches (2.5-5 cm) below the place where the tube enters your body. Taping keeps the tube from pulling on any stitches (sutures) that you have. 10. Wash your hands with soap and  water. 11. Write down the color of your drainage and how often you change your dressing. How to empty your active drain 1. Make sure that you have a measuring cup that you can empty your drainage into. 2. Wash your hands with soap and water. If soap and water are not available, use hand sanitizer. 3. Loosen any pins or clips that hold the tube in place. 4. If your health care provider tells you to strip the tube to prevent clots and tube blockages: ? Hold the tube at the skin with one hand. Use your other hand to pinch the tubing with your thumb and first finger. ? Gently move your fingers down the tube while squeezing very lightly. This clears any drainage, clots, or tissue from the tube. ? You may need to do this several times each day to keep the tube clear. Do not pull on the tube. 5. Open the bulb cap or the drain plug. Do not touch the inside of the cap or the bottom of the plug. 6. Turn the device upside down and gently squeeze. 7. Empty all of the drainage into the measuring cup. 8. Compress the bulb or the container and replace the cap or the plug. To compress the bulb or the container, squeeze it firmly in the middle while you close the cap or plug the container. 9. Write down the amount of drainage that you have in each 24-hour period. If you have less than 2 Tbsp (30 mL) of drainage during 24 hours, contact your health care provider. 10. Flush the drainage down the toilet. 11. Wash your hands with soap and water.   Contact a health care provider if:  You have redness, swelling, or pain around your drain area.  You have pus or a bad smell coming from your drain area.  You have a fever or chills.  The skin around your drain is warm to the touch.  The amount of drainage that you have is increasing instead of decreasing.  You have drainage that is cloudy.  There is a sudden stop or a sudden decrease in the amount of drainage that you have.  Your drain tube falls out.  Your  active drain does not stay compressed after you empty it. Summary  Surgical drains are used to remove extra fluid that normally builds up in a surgical wound after surgery.  Different kinds of surgical drains include active drains and passive drains. Active drains use suction to pull drainage away from the surgical wound, and passive drains allow fluid to drain naturally.  It is important to care for your drain to prevent infection. If your drain is placed at your back, or any other hard-to-reach area, ask another person to  assist you.  Contact your health care provider if you have redness, swelling, or pain around your drain area. This information is not intended to replace advice given to you by your health care provider. Make sure you discuss any questions you have with your health care provider. Document Revised: 02/19/2018 Document Reviewed: 02/19/2018 Elsevier Patient Education  2021 Elsevier Inc.         JP Drain Totals  Bring this sheet to all of your post-operative appointments while you have your drains.  Please measure your drains by CC's or ML's.  Make sure you drain and measure your JP Drains 3 times per day.  At the end of each day, add up totals for the left side and add up totals for the right side.    ( 9 am )     ( 3 pm )        ( 9 pm )                Date L  R  L  R  L  R  Total L/R                                                                                                                                                                                          Please contact your physician with any problems or Same Day Surgery at [phone], Monday through Friday 6 am to 4 pm, or [place] at [facility] Main number at [phone].

## 2020-07-01 NOTE — Anesthesia Postprocedure Evaluation (Signed)
Anesthesia Post Note  Patient: Tiffany Rhodes  Procedure(s) Performed: HERNIA REPAIR VENTRAL ADULT (N/A )  Patient location during evaluation: PACU Anesthesia Type: General Level of consciousness: awake and alert Pain management: pain level controlled Vital Signs Assessment: post-procedure vital signs reviewed and stable Respiratory status: spontaneous breathing, nonlabored ventilation, respiratory function stable and patient connected to nasal cannula oxygen Cardiovascular status: blood pressure returned to baseline and stable Postop Assessment: no apparent nausea or vomiting Anesthetic complications: no   No complications documented.   Last Vitals:  Vitals:   07/01/20 1014 07/01/20 1408  BP: (!) 101/56 112/68  Pulse: 66 75  Resp: 18 17  Temp: 36.6 C (!) 36.2 C  SpO2: 99% 100%    Last Pain:  Vitals:   07/01/20 1014  TempSrc: Temporal  PainSc: 0-No pain                 Corinda Gubler

## 2020-07-01 NOTE — Anesthesia Procedure Notes (Signed)
Anesthesia Regional Block: TAP block   Pre-Anesthetic Checklist: ,, timeout performed, Correct Patient, Correct Site, Correct Laterality, Correct Procedure, Correct Position, site marked, Risks and benefits discussed,  Surgical consent,  Pre-op evaluation,  At surgeon's request and post-op pain management  Laterality: Right and Left  Prep: chloraprep       Needles:  Injection technique: Single-shot  Needle Type: Echogenic Needle     Needle Length: 9cm  Needle Gauge: 21     Additional Needles:   Procedures:,,,, ultrasound used (permanent image in chart),,,,  Narrative:  Start time: 07/01/2020 11:53 AM End time: 07/01/2020 12:01 PM Injection made incrementally with aspirations every 5 mL.  Performed by: Personally  Anesthesiologist: Corinda Gubler, MD  Additional Notes: Block performed at request of surgeon. Patient consented for risk and benefits of nerve block including but not limited to nerve damage, failed block, bleeding and infection.  Patient voiced understanding.  Block performed after induction of general anesthesia. With ultrasound, visualized external oblique, internal oblique, and transversus abdominus muscles on both sides; block needle placed and local anesthetic deposited in fascial layer between internal oblique and transversus abdominus. Muscles appeared anatomically normal. Peritoneum was avoided.  Total injectate divided evenly between left and right sides: 70ml exparel + 54ml 0.5% bupivacaine.

## 2020-07-01 NOTE — H&P (Signed)
Tiffany Rhodes [id number] 05/09/1990     HPI:  Patient with sizable ventral hernia bulge and an approximately 5 cm infra-umbilical fascial defect.  For repair.   Medications Prior to Admission  Medication Sig Dispense Refill Last Dose  . escitalopram (LEXAPRO) 20 MG tablet Take 1 tablet (20 mg total) by mouth daily. 90 tablet 1 Past Month at Unknown time  . levonorgestrel (MIRENA, 52 MG,) 20 MCG/24HR IUD 1 Intra Uterine Device (1 each total) by Intrauterine route once for 1 dose. 1 Intra Uterine Device 0   . traZODone (DESYREL) 50 MG tablet TAKE 0.5-1 TABLETS BY MOUTH AT BEDTIME AS NEEDED FOR SLEEP. (Patient taking differently: Take 25-50 mg by mouth at bedtime as needed for sleep.) 90 tablet 1 Past Week at Unknown time   Allergies  Allergen Reactions  . Percocet [Oxycodone-Acetaminophen] Nausea And Vomiting  . Buspirone Nausea And Vomiting   Past Medical History:  Diagnosis Date  . Abnormal Pap smear of cervix    abnormal cells and pos HPV, has had neg after 2016 and 2018 pregnancies  . Anxiety   . BRCA negative 04/2020   MyRisk neg; IBIS=14.6%/riskscore=12.4%  . Depression   . Family history of ovarian cancer    mom  . History of palpitations    during pregnancy - likely patent foramen ovale was only abnormality on echo   Past Surgical History:  Procedure Laterality Date  . CESAREAN SECTION     X2  . DENTAL SURGERY    . GASTRIC BYPASS  2020   Social History   Socioeconomic History  . Marital status: Married    Spouse name: Not on file  . Number of children: Not on file  . Years of education: Not on file  . Highest education level: Not on file  Occupational History  . Not on file  Tobacco Use  . Smoking status: Never Smoker  . Smokeless tobacco: Never Used  Vaping Use  . Vaping Use: Never used  Substance and Sexual Activity  . Alcohol use: Yes    Alcohol/week: 2.0 standard drinks    Types: 2 Cans of beer per week    Comment: occasional  . Drug  use: Never  . Sexual activity: Yes    Birth control/protection: None  Other Topics Concern  . Not on file  Social History Narrative  . Not on file   Social Determinants of Health   Financial Resource Strain: Not on file  Food Insecurity: Not on file  Transportation Needs: Not on file  Physical Activity: Not on file  Stress: Not on file  Social Connections: Not on file  Intimate Partner Violence: Not on file   Social History   Social History Narrative  . Not on file     ROS: Negative.     PE: HEENT: Negative. Lungs: Clear. Cardio: RR.  Assessment/Plan:  Proceed with planned ventral hernia repair with prosthetic mesh.  Jeffrey W Byrnett 07/01/2020    

## 2020-07-01 NOTE — Addendum Note (Signed)
Addendum  created 07/01/20 1421 by Alver Fisher, MD   Order list changed, Order sets accessed, Pharmacy for encounter modified

## 2020-07-01 NOTE — Transfer of Care (Signed)
Immediate Anesthesia Transfer of Care Note  Patient: Tiffany Rhodes  Procedure(s) Performed: HERNIA REPAIR VENTRAL ADULT (N/A )  Patient Location: PACU  Anesthesia Type:General  Level of Consciousness: drowsy and patient cooperative  Airway & Oxygen Therapy: Patient Spontanous Breathing and Patient connected to face mask oxygen  Post-op Assessment: Report given to RN and Post -op Vital signs reviewed and stable  Post vital signs: Reviewed and stable  Last Vitals:  Vitals Value Taken Time  BP 112/68 07/01/20 1408  Temp 36.2 C 07/01/20 1408  Pulse 72 07/01/20 1408  Resp 17 07/01/20 1408  SpO2 100 % 07/01/20 1408  Vitals shown include unvalidated device data.  Last Pain:  Vitals:   07/01/20 1014  TempSrc: Temporal  PainSc: 0-No pain         Complications: No complications documented.

## 2020-07-04 ENCOUNTER — Encounter: Payer: Self-pay | Admitting: General Surgery

## 2020-11-22 ENCOUNTER — Ambulatory Visit: Payer: Medicaid Other | Admitting: Obstetrics and Gynecology

## 2020-11-24 ENCOUNTER — Other Ambulatory Visit: Payer: Self-pay

## 2020-11-24 ENCOUNTER — Encounter: Payer: Self-pay | Admitting: Obstetrics and Gynecology

## 2020-11-24 ENCOUNTER — Ambulatory Visit (INDEPENDENT_AMBULATORY_CARE_PROVIDER_SITE_OTHER): Payer: Medicaid Other | Admitting: Obstetrics and Gynecology

## 2020-11-24 VITALS — BP 100/60 | Ht 64.0 in | Wt 195.0 lb

## 2020-11-24 DIAGNOSIS — Z113 Encounter for screening for infections with a predominantly sexual mode of transmission: Secondary | ICD-10-CM

## 2020-11-24 DIAGNOSIS — A530 Latent syphilis, unspecified as early or late: Secondary | ICD-10-CM

## 2020-11-24 NOTE — Progress Notes (Signed)
Malfi, Lupita Raider, FNP   Chief Complaint  Patient presents with   STD testing    HPI:      Ms. Tiffany Rhodes is a 30 y.o. [id number] whose LMP was No LMP recorded. (Menstrual status: IUD)., presents today for syphilis testing. 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 Neg RPR testing 4/22 with plasma donation. No hx of vaginal sores/no new partners. No known exposures.  Mirena placed 04/28/20; neg pap/STD testing 3/22. Amenorrheic with IUD, doing well. Declines other STD testing.   Past Medical History:  Diagnosis Date   Abnormal Pap smear of cervix    abnormal cells and pos HPV, has had neg after 2016 and 2018 pregnancies   Anxiety    BRCA negative 04/2020   MyRisk neg; IBIS=14.6%/riskscore=12.4%   Depression    Family history of ovarian cancer    mom   History of palpitations    during pregnancy - likely patent foramen ovale was only abnormality on echo    Past Surgical History:  Procedure Laterality Date   CESAREAN SECTION     X2   DENTAL SURGERY     GASTRIC BYPASS  2020   VENTRAL HERNIA REPAIR N/A 07/01/2020   Procedure: HERNIA REPAIR VENTRAL ADULT;  Surgeon: Robert Bellow, MD;  Location: [facility] ORS;  Service: General;  Laterality: N/A;  RNFA needed; TAPP Block per anesthesia    Family History  Problem Relation Age of Onset   Ovarian cancer Mother 69   Skin cancer Father        79s   Heart attack Father    Heart failure Father    Hypertension Father    Ovarian cysts Sister    Breast cancer Maternal Grandmother 63   Diabetes Paternal Grandmother    Skin cancer Paternal Grandfather        21s   Diabetes Paternal Grandfather     Social History   Socioeconomic History   Marital status: Married    Spouse name: Not on file   Number of children: Not on file   Years of education: Not on file   Highest education level: Not on file  Occupational  History   Not on file  Tobacco Use   Smoking status: Never   Smokeless tobacco: Never  Vaping Use   Vaping Use: Never used  Substance and Sexual Activity   Alcohol use: Yes    Alcohol/week: 2.0 standard drinks    Types: 2 Cans of beer per week    Comment: occasional   Drug use: Never   Sexual activity: Yes    Birth control/protection: I.U.D.    Comment: Mirena  Other Topics Concern   Not on file  Social History Narrative   Not on file   Social Determinants of Health   Financial Resource Strain: Not on file  Food Insecurity: Not on file  Transportation Needs: Not on file  Physical Activity: Not on file  Stress: Not on file  Social Connections: Not on file  Intimate Partner Violence: Not on file    Outpatient Medications Prior to Visit  Medication Sig Dispense Refill   escitalopram (LEXAPRO) 20 MG tablet Take 1 tablet (20 mg total) by mouth daily. 90 tablet 1   traZODone (DESYREL) 50 MG tablet TAKE 0.5-1 TABLETS BY MOUTH AT BEDTIME AS NEEDED FOR SLEEP. (Patient taking differently: Take 25-50 mg by mouth at bedtime as  needed for sleep.) 90 tablet 1   levonorgestrel (MIRENA, 52 MG,) 20 MCG/24HR IUD 1 Intra Uterine Device (1 each total) by Intrauterine route once for 1 dose. 1 Intra Uterine Device 0   cefadroxil (DURICEF) 500 MG capsule Take 1 capsule (500 mg total) by mouth 2 (two) times daily. 14 capsule 0   HYDROcodone-acetaminophen (NORCO/VICODIN) 5-325 MG tablet Take 1 tablet by mouth every 4 (four) hours as needed for moderate pain. 20 tablet 0   ondansetron (ZOFRAN ODT) 4 MG disintegrating tablet Take 1 tablet (4 mg total) by mouth every 8 (eight) hours as needed for nausea or vomiting. 20 tablet 0   No facility-administered medications prior to visit.      ROS:  Review of Systems  Constitutional:  Negative for fever.  Gastrointestinal:  Negative for blood in stool, constipation, diarrhea, nausea and vomiting.  Genitourinary:  Negative for dyspareunia, dysuria,  flank pain, frequency, hematuria, urgency, vaginal bleeding, vaginal discharge and vaginal pain.  Musculoskeletal:  Negative for back pain.  Skin:  Negative for rash.  BREAST: No symptoms   OBJECTIVE:   Vitals:  BP 100/60   Ht '5\' 4"'  (1.626 m)   BMI 30.05 kg/m   Physical Exam Constitutional:      Appearance: Normal appearance.  Pulmonary:     Effort: Pulmonary effort is normal.  Musculoskeletal:        General: Normal range of motion.  Neurological:     Mental Status: She is alert and oriented to person, place, and time.  Psychiatric:        Judgment: Judgment normal.    Assessment/Plan: Positive RPR test - Plan: RPR; check RPR with confirmatory test. Will f/u with results. Most likely a false pos with plasma donation.  Screening for STD (sexually transmitted disease) - Plan: RPR    Return if symptoms worsen or fail to improve.  Harel Repetto B. Kymari Nuon, PA-C 11/24/2020 9:00 AM

## 2020-11-26 ENCOUNTER — Encounter: Payer: Self-pay | Admitting: Obstetrics and Gynecology

## 2020-11-26 DIAGNOSIS — A530 Latent syphilis, unspecified as early or late: Secondary | ICD-10-CM

## 2020-11-26 LAB — RPR, QUANT+TP ABS (REFLEX)
Rapid Plasma Reagin, Quant: 1:1 {titer} — ABNORMAL HIGH
T Pallidum Abs: NONREACTIVE

## 2020-11-26 LAB — RPR: RPR Ser Ql: REACTIVE — AB

## 2021-01-03 NOTE — Telephone Encounter (Signed)
Apt 04/28/20 per Epic. Mirena rcvd/charged 04/28/20

## 2021-01-20 ENCOUNTER — Other Ambulatory Visit: Payer: Self-pay

## 2021-01-20 DIAGNOSIS — A530 Latent syphilis, unspecified as early or late: Secondary | ICD-10-CM

## 2021-01-24 ENCOUNTER — Ambulatory Visit: Payer: Medicaid Other | Admitting: Obstetrics and Gynecology

## 2021-01-26 ENCOUNTER — Ambulatory Visit: Payer: Medicaid Other | Admitting: Internal Medicine

## 2021-01-31 ENCOUNTER — Ambulatory Visit: Payer: Medicaid Other | Admitting: Obstetrics and Gynecology

## 2021-02-22 DIAGNOSIS — Z713 Dietary counseling and surveillance: Secondary | ICD-10-CM | POA: Diagnosis not present

## 2021-02-22 DIAGNOSIS — Z9884 Bariatric surgery status: Secondary | ICD-10-CM | POA: Diagnosis not present

## 2021-02-22 DIAGNOSIS — E669 Obesity, unspecified: Secondary | ICD-10-CM | POA: Diagnosis not present

## 2021-03-02 DIAGNOSIS — R948 Abnormal results of function studies of other organs and systems: Secondary | ICD-10-CM | POA: Diagnosis not present

## 2021-03-02 DIAGNOSIS — R635 Abnormal weight gain: Secondary | ICD-10-CM | POA: Diagnosis not present

## 2021-03-17 ENCOUNTER — Ambulatory Visit: Payer: Medicaid Other | Admitting: Internal Medicine

## 2021-03-17 NOTE — Progress Notes (Unsigned)
Subjective:    Patient ID: Tiffany Rhodes, female    DOB: 29-Apr-1990, 31 y.o.   MRN: [id number]  HPI  Pt presents to the clinic today for follow up of chronic conditions.  Anxiety and Depression: Chronic, managed on Escitalopram. She has also been on Sertraline, Duloxetine and Venlafaxine in the past. She is not currently seeing a therapist. She denies SI/HI.  Insomnia: She has difficulty. She is taking Trazodone as prescribed. There is no sleep study on file.   Review of Systems     Past Medical History:  Diagnosis Date   Abnormal Pap smear of cervix    abnormal cells and pos HPV, has had neg after 2016 and 2018 pregnancies   Anxiety    BRCA negative 04/2020   MyRisk neg; IBIS=14.6%/riskscore=12.4%   Depression    Family history of ovarian cancer    mom   History of palpitations    during pregnancy - likely patent foramen ovale was only abnormality on echo    Current Outpatient Medications  Medication Sig Dispense Refill   escitalopram (LEXAPRO) 20 MG tablet Take 1 tablet (20 mg total) by mouth daily. 90 tablet 1   levonorgestrel (MIRENA, 52 MG,) 20 MCG/24HR IUD 1 Intra Uterine Device (1 each total) by Intrauterine route once for 1 dose. 1 Intra Uterine Device 0   traZODone (DESYREL) 50 MG tablet TAKE 0.5-1 TABLETS BY MOUTH AT BEDTIME AS NEEDED FOR SLEEP. (Patient taking differently: Take 25-50 mg by mouth at bedtime as needed for sleep.) 90 tablet 1   No current facility-administered medications for this visit.    Allergies  Allergen Reactions   Percocet [Oxycodone-Acetaminophen] Nausea And Vomiting   Buspirone Nausea And Vomiting    Family History  Problem Relation Age of Onset   Ovarian cancer Mother 32   Skin cancer Father        32s   Heart attack Father    Heart failure Father    Hypertension Father    Ovarian cysts Sister    Breast cancer Maternal Grandmother 7   Diabetes Paternal Grandmother    Skin cancer Paternal Grandfather        69s    Diabetes Paternal Grandfather     Social History   Socioeconomic History   Marital status: Married    Spouse name: Not on file   Number of children: Not on file   Years of education: Not on file   Highest education level: Not on file  Occupational History   Not on file  Tobacco Use   Smoking status: Never   Smokeless tobacco: Never  Vaping Use   Vaping Use: Never used  Substance and Sexual Activity   Alcohol use: Yes    Alcohol/week: 2.0 standard drinks    Types: 2 Cans of beer per week    Comment: occasional   Drug use: Never   Sexual activity: Yes    Birth control/protection: I.U.D.    Comment: Mirena  Other Topics Concern   Not on file  Social History Narrative   Not on file   Social Determinants of Health   Financial Resource Strain: Not on file  Food Insecurity: Not on file  Transportation Needs: Not on file  Physical Activity: Not on file  Stress: Not on file  Social Connections: Not on file  Intimate Partner Violence: Not on file     Constitutional: Denies fever, malaise, fatigue, headache or abrupt weight changes.  HEENT: Denies eye pain, eye redness,  ear pain, ringing in the ears, wax buildup, runny nose, nasal congestion, bloody nose, or sore throat. Respiratory: Denies difficulty breathing, shortness of breath, cough or sputum production.   Cardiovascular: Denies chest pain, chest tightness, palpitations or swelling in the hands or feet.  Gastrointestinal: Denies abdominal pain, bloating, constipation, diarrhea or blood in the stool.  GU: Denies urgency, frequency, pain with urination, burning sensation, blood in urine, odor or discharge. Musculoskeletal: Denies decrease in range of motion, difficulty with gait, muscle pain or joint pain and swelling.  Skin: Denies redness, rashes, lesions or ulcercations.  Neurological: Pt reports insomnia. Denies dizziness, difficulty with memory, difficulty with speech or problems with balance and coordination.   Psych: Pt has a history of anxiety and depression. Denies SI/HI.  No other specific complaints in a complete review of systems (except as listed in HPI above).  Objective:   Physical Exam   There were no vitals taken for this visit. Wt Readings from Last 3 Encounters:  11/24/20 195 lb (88.5 kg)  07/01/20 175 lb 0.7 oz (79.4 kg)  06/22/20 175 lb (79.4 kg)    General: Appears their stated age, well developed, well nourished in NAD. Skin: Warm, dry and intact. No rashes, lesions or ulcerations noted. HEENT: Head: normal shape and size; Eyes: sclera white, no icterus, conjunctiva pink, PERRLA and EOMs intact; Ears: Tm's gray and intact, normal light reflex; Nose: mucosa pink and moist, septum midline; Throat/Mouth: Teeth present, mucosa pink and moist, no exudate, lesions or ulcerations noted.  Neck:  Neck supple, trachea midline. No masses, lumps or thyromegaly present.  Cardiovascular: Normal rate and rhythm. S1,S2 noted.  No murmur, rubs or gallops noted. No JVD or BLE edema. No carotid bruits noted. Pulmonary/Chest: Normal effort and positive vesicular breath sounds. No respiratory distress. No wheezes, rales or ronchi noted.  Abdomen: Soft and nontender. Normal bowel sounds. No distention or masses noted. Liver, spleen and kidneys non palpable. Musculoskeletal: Normal range of motion. No signs of joint swelling. No difficulty with gait.  Neurological: Alert and oriented. Cranial nerves II-XII grossly intact. Coordination normal.  Psychiatric: Mood and affect normal. Behavior is normal. Judgment and thought content normal.        Assessment & Plan:     Webb Silversmith, NP This visit occurred during the SARS-CoV-2 public health emergency.  Safety protocols were in place, including screening questions prior to the visit, additional usage of staff PPE, and extensive cleaning of exam room while observing appropriate contact time as indicated for disinfecting solutions.

## 2021-03-20 ENCOUNTER — Telehealth: Payer: Self-pay

## 2021-03-20 NOTE — Telephone Encounter (Signed)
Copied from [place] [id number]. Topic: General - Other >> Mar 20, 2021 11:20 AM Yvette Rack wrote: Reason for CRM: Pt requests call back to discuss Rx for medication for anxiety. Cb# [phone]

## 2021-03-20 NOTE — Telephone Encounter (Signed)
Advised pt she needs an appointment to discuss treatment options.  She states she is not interested in making an appointment but will go somewhere else.    Thanks,   -Vernona Rieger

## 2021-04-11 DIAGNOSIS — F909 Attention-deficit hyperactivity disorder, unspecified type: Secondary | ICD-10-CM | POA: Diagnosis not present

## 2021-04-11 DIAGNOSIS — R413 Other amnesia: Secondary | ICD-10-CM | POA: Diagnosis not present

## 2021-04-11 DIAGNOSIS — Z6833 Body mass index (BMI) 33.0-33.9, adult: Secondary | ICD-10-CM | POA: Diagnosis not present

## 2021-04-11 DIAGNOSIS — F419 Anxiety disorder, unspecified: Secondary | ICD-10-CM | POA: Diagnosis not present

## 2021-04-11 DIAGNOSIS — E6609 Other obesity due to excess calories: Secondary | ICD-10-CM | POA: Diagnosis not present

## 2021-04-12 DIAGNOSIS — Z0000 Encounter for general adult medical examination without abnormal findings: Secondary | ICD-10-CM | POA: Diagnosis not present

## 2021-04-12 DIAGNOSIS — Z6833 Body mass index (BMI) 33.0-33.9, adult: Secondary | ICD-10-CM | POA: Diagnosis not present

## 2021-04-12 DIAGNOSIS — F909 Attention-deficit hyperactivity disorder, unspecified type: Secondary | ICD-10-CM | POA: Diagnosis not present

## 2021-04-12 DIAGNOSIS — F419 Anxiety disorder, unspecified: Secondary | ICD-10-CM | POA: Diagnosis not present

## 2021-04-12 DIAGNOSIS — E6609 Other obesity due to excess calories: Secondary | ICD-10-CM | POA: Diagnosis not present

## 2021-04-12 DIAGNOSIS — Z13220 Encounter for screening for lipoid disorders: Secondary | ICD-10-CM | POA: Diagnosis not present

## 2021-04-12 DIAGNOSIS — Z9884 Bariatric surgery status: Secondary | ICD-10-CM | POA: Diagnosis not present

## 2021-04-28 DIAGNOSIS — F331 Major depressive disorder, recurrent, moderate: Secondary | ICD-10-CM | POA: Diagnosis not present

## 2021-04-28 DIAGNOSIS — F419 Anxiety disorder, unspecified: Secondary | ICD-10-CM | POA: Diagnosis not present

## 2021-04-28 DIAGNOSIS — F909 Attention-deficit hyperactivity disorder, unspecified type: Secondary | ICD-10-CM | POA: Diagnosis not present

## 2021-05-15 DIAGNOSIS — Z6834 Body mass index (BMI) 34.0-34.9, adult: Secondary | ICD-10-CM | POA: Diagnosis not present

## 2021-05-15 DIAGNOSIS — F419 Anxiety disorder, unspecified: Secondary | ICD-10-CM | POA: Diagnosis not present

## 2021-05-15 DIAGNOSIS — F901 Attention-deficit hyperactivity disorder, predominantly hyperactive type: Secondary | ICD-10-CM | POA: Diagnosis not present

## 2021-05-15 DIAGNOSIS — E6609 Other obesity due to excess calories: Secondary | ICD-10-CM | POA: Diagnosis not present

## 2021-06-01 DIAGNOSIS — E6609 Other obesity due to excess calories: Secondary | ICD-10-CM | POA: Diagnosis not present

## 2021-06-01 DIAGNOSIS — F901 Attention-deficit hyperactivity disorder, predominantly hyperactive type: Secondary | ICD-10-CM | POA: Diagnosis not present

## 2021-06-01 DIAGNOSIS — Z6833 Body mass index (BMI) 33.0-33.9, adult: Secondary | ICD-10-CM | POA: Diagnosis not present

## 2021-06-01 DIAGNOSIS — F419 Anxiety disorder, unspecified: Secondary | ICD-10-CM | POA: Diagnosis not present

## 2021-06-16 DIAGNOSIS — F419 Anxiety disorder, unspecified: Secondary | ICD-10-CM | POA: Diagnosis not present

## 2021-06-16 DIAGNOSIS — F331 Major depressive disorder, recurrent, moderate: Secondary | ICD-10-CM | POA: Diagnosis not present

## 2021-08-08 DIAGNOSIS — E538 Deficiency of other specified B group vitamins: Secondary | ICD-10-CM | POA: Diagnosis not present

## 2021-08-08 DIAGNOSIS — Z9884 Bariatric surgery status: Secondary | ICD-10-CM | POA: Diagnosis not present

## 2021-08-08 DIAGNOSIS — F901 Attention-deficit hyperactivity disorder, predominantly hyperactive type: Secondary | ICD-10-CM | POA: Diagnosis not present

## 2021-08-08 DIAGNOSIS — E559 Vitamin D deficiency, unspecified: Secondary | ICD-10-CM | POA: Diagnosis not present

## 2021-09-26 DIAGNOSIS — Z9884 Bariatric surgery status: Secondary | ICD-10-CM | POA: Diagnosis not present

## 2021-09-26 DIAGNOSIS — R635 Abnormal weight gain: Secondary | ICD-10-CM | POA: Diagnosis not present

## 2021-09-29 DIAGNOSIS — F419 Anxiety disorder, unspecified: Secondary | ICD-10-CM | POA: Diagnosis not present

## 2021-09-29 DIAGNOSIS — F901 Attention-deficit hyperactivity disorder, predominantly hyperactive type: Secondary | ICD-10-CM | POA: Diagnosis not present

## 2021-09-29 DIAGNOSIS — M2550 Pain in unspecified joint: Secondary | ICD-10-CM | POA: Diagnosis not present

## 2021-09-29 DIAGNOSIS — E6609 Other obesity due to excess calories: Secondary | ICD-10-CM | POA: Diagnosis not present

## 2021-09-29 DIAGNOSIS — G8929 Other chronic pain: Secondary | ICD-10-CM | POA: Diagnosis not present

## 2021-09-29 DIAGNOSIS — Z6834 Body mass index (BMI) 34.0-34.9, adult: Secondary | ICD-10-CM | POA: Diagnosis not present

## 2021-10-06 DIAGNOSIS — E6609 Other obesity due to excess calories: Secondary | ICD-10-CM | POA: Diagnosis not present

## 2021-10-06 DIAGNOSIS — Z0001 Encounter for general adult medical examination with abnormal findings: Secondary | ICD-10-CM | POA: Diagnosis not present

## 2021-10-06 DIAGNOSIS — Z6833 Body mass index (BMI) 33.0-33.9, adult: Secondary | ICD-10-CM | POA: Diagnosis not present

## 2021-10-06 DIAGNOSIS — F901 Attention-deficit hyperactivity disorder, predominantly hyperactive type: Secondary | ICD-10-CM | POA: Diagnosis not present

## 2021-10-06 DIAGNOSIS — F419 Anxiety disorder, unspecified: Secondary | ICD-10-CM | POA: Diagnosis not present

## 2021-10-06 DIAGNOSIS — M2550 Pain in unspecified joint: Secondary | ICD-10-CM | POA: Diagnosis not present

## 2021-10-06 DIAGNOSIS — G8929 Other chronic pain: Secondary | ICD-10-CM | POA: Diagnosis not present

## 2021-10-06 DIAGNOSIS — Z0000 Encounter for general adult medical examination without abnormal findings: Secondary | ICD-10-CM | POA: Diagnosis not present

## 2021-10-06 DIAGNOSIS — Z130 Encounter for screening for diseases of the blood and blood-forming organs and certain disorders involving the immune mechanism: Secondary | ICD-10-CM | POA: Diagnosis not present

## 2021-10-06 DIAGNOSIS — Z13228 Encounter for screening for other metabolic disorders: Secondary | ICD-10-CM | POA: Diagnosis not present

## 2021-12-11 DIAGNOSIS — K469 Unspecified abdominal hernia without obstruction or gangrene: Secondary | ICD-10-CM | POA: Diagnosis not present

## 2021-12-11 DIAGNOSIS — D509 Iron deficiency anemia, unspecified: Secondary | ICD-10-CM | POA: Diagnosis not present

## 2021-12-11 DIAGNOSIS — R1031 Right lower quadrant pain: Secondary | ICD-10-CM | POA: Diagnosis not present

## 2021-12-11 DIAGNOSIS — Z6835 Body mass index (BMI) 35.0-35.9, adult: Secondary | ICD-10-CM | POA: Diagnosis not present

## 2021-12-11 DIAGNOSIS — R634 Abnormal weight loss: Secondary | ICD-10-CM | POA: Diagnosis not present

## 2021-12-18 ENCOUNTER — Other Ambulatory Visit: Payer: Self-pay | Admitting: Obstetrics and Gynecology

## 2021-12-18 DIAGNOSIS — Z113 Encounter for screening for infections with a predominantly sexual mode of transmission: Secondary | ICD-10-CM

## 2021-12-18 NOTE — Progress Notes (Signed)
Repeat RPR due to hx of false positive in past

## 2021-12-20 ENCOUNTER — Other Ambulatory Visit: Payer: Medicaid Other

## 2021-12-20 DIAGNOSIS — Z113 Encounter for screening for infections with a predominantly sexual mode of transmission: Secondary | ICD-10-CM | POA: Diagnosis not present

## 2021-12-21 LAB — RPR: RPR Ser Ql: NONREACTIVE

## 2021-12-22 IMAGING — CT CT ABD-PELV W/O CM
2 of 4 series · 15 of 46 positions shown, 17 images · non-contrast
Comparison: None.

CLINICAL DATA: Left lower abdominal bulging for 4 years, noted
after most recent Caesarean section. History of gastric bypass,
C-section x2 and IUD.

EXAM:
CT ABDOMEN AND PELVIS WITHOUT CONTRAST
TECHNIQUE: Multidetector CT imaging of the abdomen and pelvis was performed
following the standard protocol without IV contrast.

[Series 2: axials routine abdomen pelvis without 5.00 · axial · non-contrast · 0.67mm/px · z∈[-1565,-1115]mm · 12 of 100 slices shown, 14 images]
[im 5/100  soft-tissue]
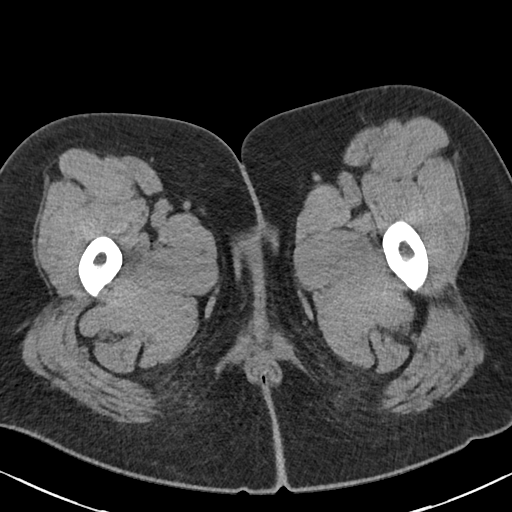
[im 5/100  bone]
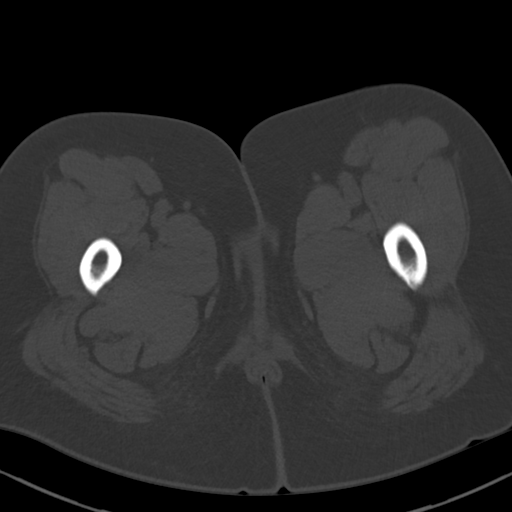
[im 13/100  soft-tissue]
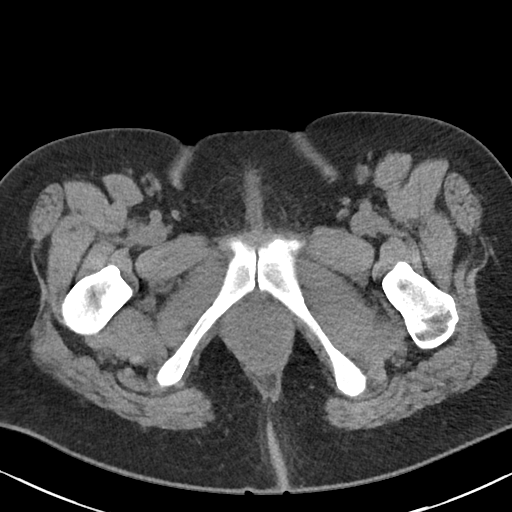
[im 21/100  soft-tissue]
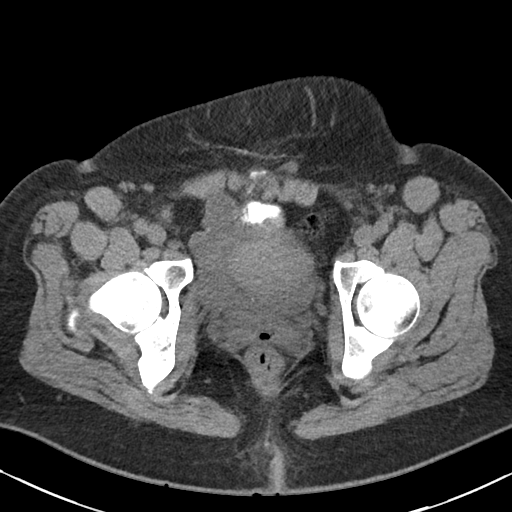
[im 29/100  soft-tissue]
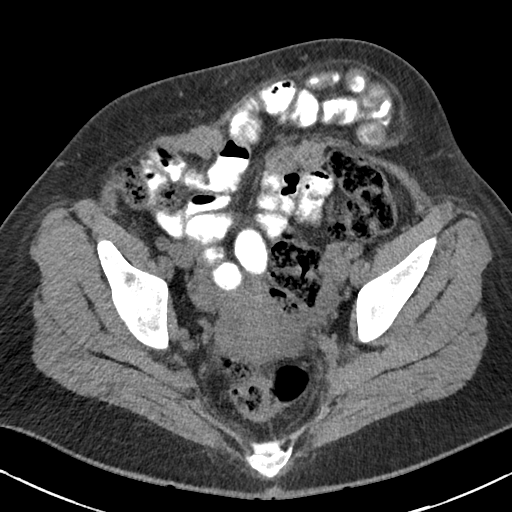
[im 38/100  soft-tissue]
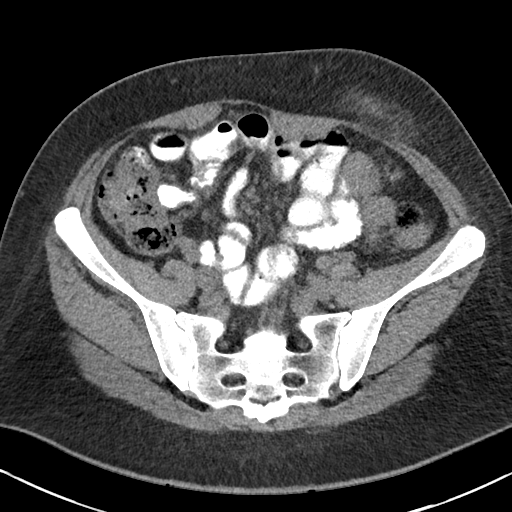
[im 46/100  soft-tissue]
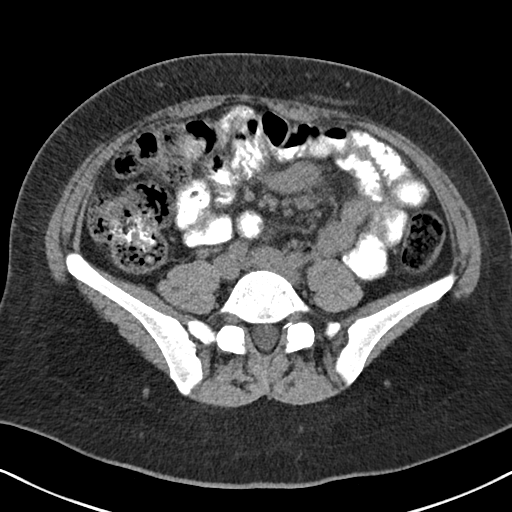
[im 54/100  soft-tissue]
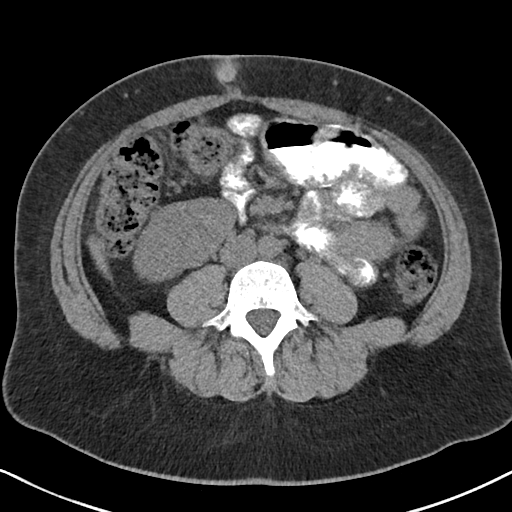
[im 62/100  soft-tissue]
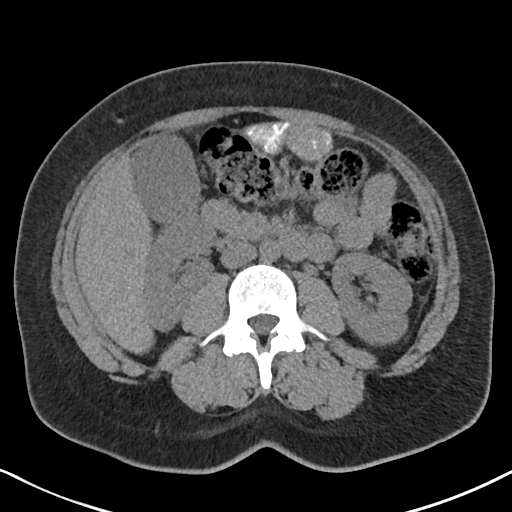
[im 71/100  soft-tissue]
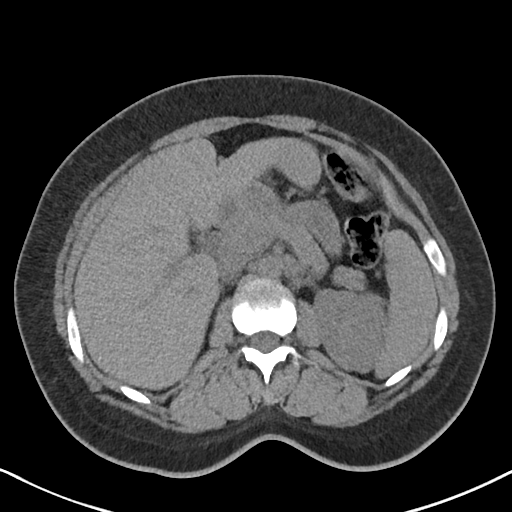
[im 71/100  bone]
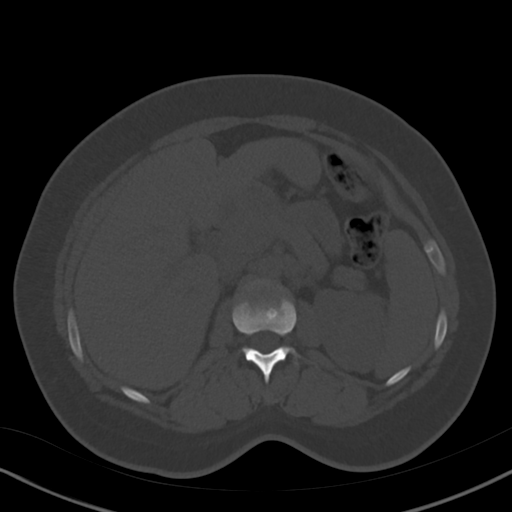
[im 79/100  soft-tissue]
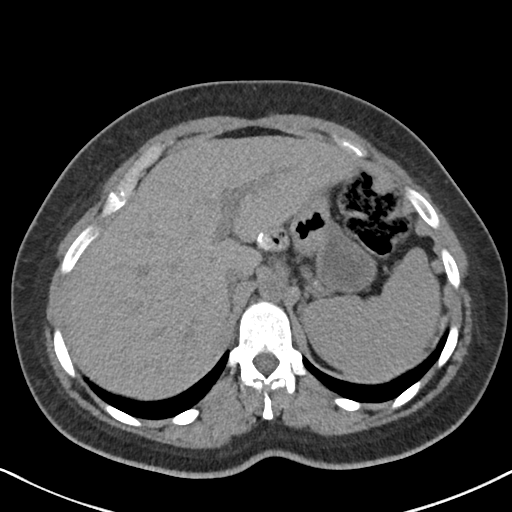
[im 87/100  soft-tissue]
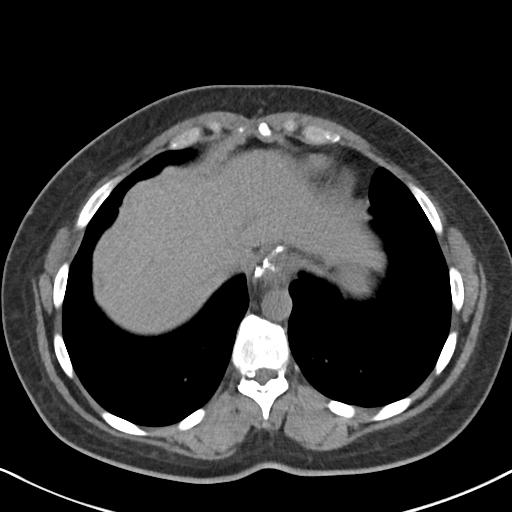
[im 95/100  soft-tissue]
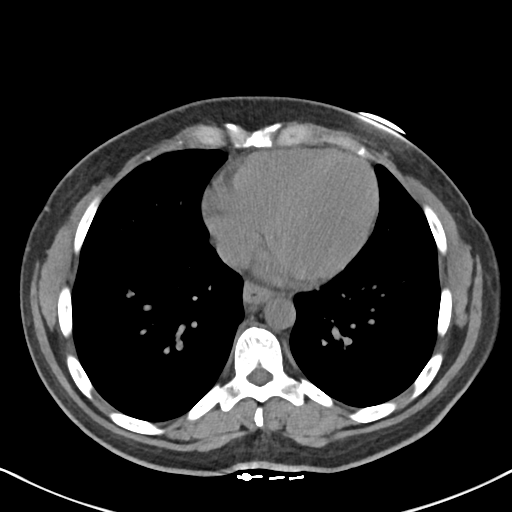

[Series 4: coronals routine abdomen pelvis without 2.00 cor · coronal · non-contrast · 0.67mm/px · 3 of 152 slices shown]
[im 51/152  soft-tissue]
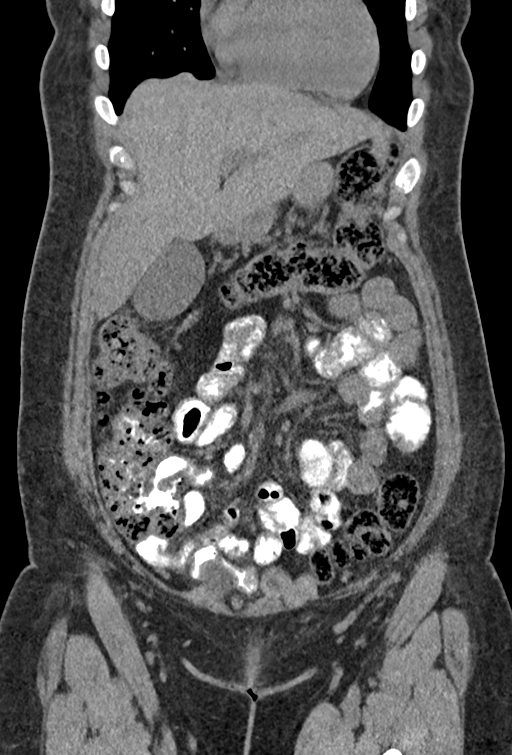
[im 68/152  soft-tissue]
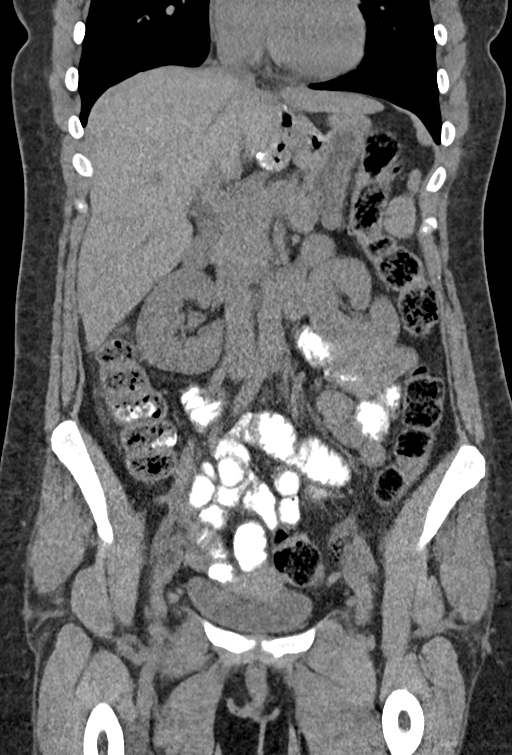
[im 84/152  soft-tissue]
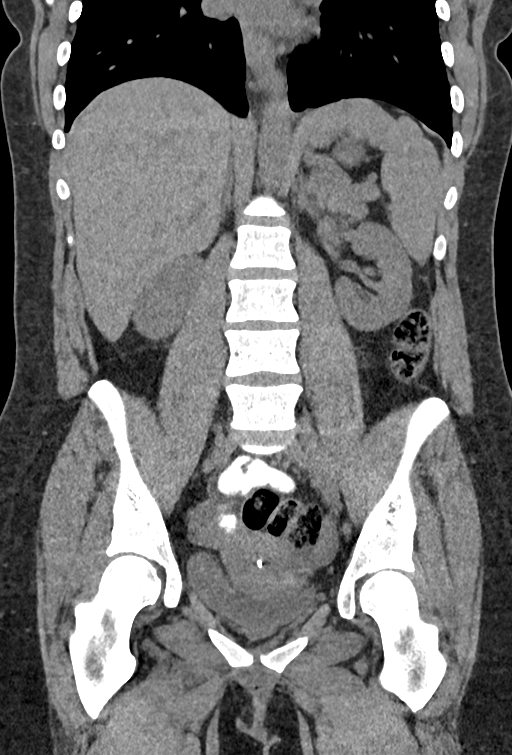

[15 of 46 positions shown; findings below may reference images not displayed]

FINDINGS: Lower chest: Lung bases are clear. Normal heart size. No pericardial
effusion.

Hepatobiliary: No worrisome focal liver abnormality is seen. Normal
gallbladder. No visible calcified gallstones. No biliary ductal
dilatation.

Pancreas: No pancreatic ductal dilatation or surrounding
inflammatory changes.

Spleen: Normal in size. No concerning splenic lesions.

Adrenals/Urinary Tract: Normal adrenal glands. Kidneys are symmetric
in size and normally located. No visible or contour deforming renal
lesion. No urolithiasis or hydronephrosis. Urinary bladder is
largely decompressed at the time of exam and therefore poorly
evaluated by CT imaging. No gross bladder abnormality accounting for
underdistention.

Stomach/Bowel: Small sliding-type hiatal hernia. Postsurgical
changes from an antecolic Roux-en-Y gastric bypass. High attenuation
enteric contrast media traverses to the level of the cecum. This
passes beyond the level of a fat and bowel containing ventral hernia
(2/70) hernia defect measures approximately 3.1 x 3.6 cm in
craniocaudal by transverse dimension. No abnormal bowel wall
thickening, significant stranding or features of mechanical
obstruction or vascular compromise. Moderate colonic stool burden.
No worrisome colonic wall thickening or dilatation.

Vascular/Lymphatic: No significant vascular findings are present. No
enlarged abdominal or pelvic lymph nodes.

Reproductive: Anteverted uterus. Expected positioning of the
radiopaque dense IUD. No concerning adnexal masses or lesions.

Other: Fat and bowel containing likely incisional hernia along the
patient's prior Pfannenstiel incision protruding through the rectus
muscle bellies with defect as above. Additional fat containing left
inguinal hernia. No abdominopelvic free air or fluid.

Musculoskeletal: No acute osseous abnormality or suspicious osseous
lesion.
IMPRESSION: Low anterior abdominal ventral fat and bowel containing hernia along
the site of prior Pfannenstiel incision protruding through the
midline rectus sheath. No evidence of mechanical obstruction or
vascular compromise is seen at this time.

Additional postsurgical changes from prior anti colic Roux-en-Y
gastric bypass without acute complication.

Expected positioning of the radiodense IUD.

## 2021-12-26 DIAGNOSIS — R1031 Right lower quadrant pain: Secondary | ICD-10-CM | POA: Diagnosis not present

## 2021-12-26 DIAGNOSIS — K389 Disease of appendix, unspecified: Secondary | ICD-10-CM | POA: Diagnosis not present

## 2022-01-17 DIAGNOSIS — R935 Abnormal findings on diagnostic imaging of other abdominal regions, including retroperitoneum: Secondary | ICD-10-CM | POA: Diagnosis not present

## 2022-01-17 DIAGNOSIS — R1031 Right lower quadrant pain: Secondary | ICD-10-CM | POA: Diagnosis not present

## 2022-01-17 DIAGNOSIS — R109 Unspecified abdominal pain: Secondary | ICD-10-CM | POA: Diagnosis not present

## 2022-01-25 DIAGNOSIS — F331 Major depressive disorder, recurrent, moderate: Secondary | ICD-10-CM | POA: Diagnosis not present

## 2022-01-25 DIAGNOSIS — F419 Anxiety disorder, unspecified: Secondary | ICD-10-CM | POA: Diagnosis not present

## 2022-02-05 NOTE — Progress Notes (Unsigned)
Malfi, Lupita Raider, FNP   No chief complaint on file.   HPI:      Ms. Tiffany Rhodes is a 32 y.o. [id number] whose LMP was No LMP recorded. (Menstrual status: IUD)., presents today for *** Mirena placed 04/28/20 Neg pap 3/22  Patient Active Problem List   Diagnosis Date Noted   Moderate episode of recurrent major depressive disorder ([place]) 04/14/2019   GAD (generalized anxiety disorder) 03/12/2018   Psychophysiological insomnia 03/12/2018    Past Surgical History:  Procedure Laterality Date   CESAREAN SECTION     X2   DENTAL SURGERY     GASTRIC BYPASS  2020   VENTRAL HERNIA REPAIR N/A 07/01/2020   Procedure: HERNIA REPAIR VENTRAL ADULT;  Surgeon: Robert Bellow, MD;  Location: [facility] ORS;  Service: General;  Laterality: N/A;  RNFA needed; TAPP Block per anesthesia    Family History  Problem Relation Age of Onset   Ovarian cancer Mother 5   Skin cancer Father        52s   Heart attack Father    Heart failure Father    Hypertension Father    Ovarian cysts Sister    Breast cancer Maternal Grandmother 15   Diabetes Paternal Grandmother    Skin cancer Paternal Grandfather        44s   Diabetes Paternal Grandfather     Social History   Socioeconomic History   Marital status: Married    Spouse name: Not on file   Number of children: Not on file   Years of education: Not on file   Highest education level: Not on file  Occupational History   Not on file  Tobacco Use   Smoking status: Never   Smokeless tobacco: Never  Vaping Use   Vaping Use: Never used  Substance and Sexual Activity   Alcohol use: Yes    Alcohol/week: 2.0 standard drinks of alcohol    Types: 2 Cans of beer per week    Comment: occasional   Drug use: Never   Sexual activity: Yes    Birth control/protection: I.U.D.    Comment: Mirena  Other Topics Concern   Not on file  Social History Narrative   Not on file   Social Determinants of Health   Financial Resource Strain: Not on  file  Food Insecurity: Not on file  Transportation Needs: Not on file  Physical Activity: Not on file  Stress: Not on file  Social Connections: Not on file  Intimate Partner Violence: Not on file    Outpatient Medications Prior to Visit  Medication Sig Dispense Refill   escitalopram (LEXAPRO) 20 MG tablet Take 1 tablet (20 mg total) by mouth daily. 90 tablet 1   levonorgestrel (MIRENA, 52 MG,) 20 MCG/24HR IUD 1 Intra Uterine Device (1 each total) by Intrauterine route once for 1 dose. 1 Intra Uterine Device 0   traZODone (DESYREL) 50 MG tablet TAKE 0.5-1 TABLETS BY MOUTH AT BEDTIME AS NEEDED FOR SLEEP. (Patient taking differently: Take 25-50 mg by mouth at bedtime as needed for sleep.) 90 tablet 1   No facility-administered medications prior to visit.      ROS:  Review of Systems BREAST: No symptoms   OBJECTIVE:   Vitals:  There were no vitals taken for this visit.  Physical Exam  Results: No results found for this or any previous visit (from the past 24 hour(s)).   Assessment/Plan: No diagnosis found.    No orders of the  defined types were placed in this encounter.     No follow-ups on file.  Concepcion Gillott B. Sedalia Greeson, PA-C 02/05/2022 7:44 PM

## 2022-02-06 ENCOUNTER — Encounter: Payer: Self-pay | Admitting: Obstetrics and Gynecology

## 2022-02-06 ENCOUNTER — Ambulatory Visit: Payer: Medicaid Other | Admitting: Obstetrics and Gynecology

## 2022-02-06 VITALS — BP 110/70 | Ht 64.0 in | Wt 204.0 lb

## 2022-02-06 DIAGNOSIS — R1031 Right lower quadrant pain: Secondary | ICD-10-CM

## 2022-02-06 DIAGNOSIS — Z30431 Encounter for routine checking of intrauterine contraceptive device: Secondary | ICD-10-CM | POA: Diagnosis not present

## 2022-02-06 DIAGNOSIS — N393 Stress incontinence (female) (male): Secondary | ICD-10-CM

## 2022-02-06 NOTE — Patient Instructions (Signed)
I value your feedback and you entrusting us with your care. If you get a [facility] patient survey, I would appreciate you taking the time to let us know about your experience today. Thank you! ? ? ?

## 2022-02-07 ENCOUNTER — Other Ambulatory Visit: Payer: Self-pay

## 2022-02-07 ENCOUNTER — Emergency Department
Admission: EM | Admit: 2022-02-07 | Discharge: 2022-02-07 | Disposition: A | Discharge status: Home / Self Care | Payer: Medicaid Other | Attending: Emergency Medicine | Admitting: Emergency Medicine

## 2022-02-07 DIAGNOSIS — R55 Syncope and collapse: Secondary | ICD-10-CM | POA: Diagnosis not present

## 2022-02-07 DIAGNOSIS — I951 Orthostatic hypotension: Secondary | ICD-10-CM | POA: Diagnosis not present

## 2022-02-07 LAB — COMPREHENSIVE METABOLIC PANEL
ALT: 19 U/L (ref 0–44)
AST: 20 U/L (ref 15–41)
Albumin: 3.6 g/dL (ref 3.5–5.0)
Alkaline Phosphatase: 53 U/L (ref 38–126)
Anion gap: 5 (ref 5–15)
BUN: 19 mg/dL (ref 6–20)
CO2: 29 mmol/L (ref 22–32)
Calcium: 8.7 mg/dL — ABNORMAL LOW (ref 8.9–10.3)
Chloride: 103 mmol/L (ref 98–111)
Creatinine, Ser: 0.7 mg/dL (ref 0.44–1.00)
GFR, Estimated: >60 mL/min (ref >60)
Glucose, Bld: 74 mg/dL (ref 70–99)
Potassium: 3.8 mmol/L (ref 3.5–5.1)
Sodium: 137 mmol/L (ref 135–145)
Total Bilirubin: 0.6 mg/dL (ref 0.3–1.2)
Total Protein: 6.5 g/dL (ref 6.5–8.1)

## 2022-02-07 LAB — CBC
HCT: 41.9 % (ref 36.0–46.0)
Hemoglobin: 12.8 g/dL (ref 12.0–15.0)
MCH: 25.8 pg — ABNORMAL LOW (ref 26.0–34.0)
MCHC: 30.5 g/dL (ref 30.0–36.0)
MCV: 84.3 fL (ref 80.0–100.0)
Platelets: 296 10*3/uL (ref 150–400)
RBC: 4.97 MIL/uL (ref 3.87–5.11)
RDW: 13.8 % (ref 11.5–15.5)
WBC: 5.7 10*3/uL (ref 4.0–10.5)
nRBC: 0 % (ref 0.0–0.2)

## 2022-02-07 LAB — TROPONIN I (HIGH SENSITIVITY): Troponin I (High Sensitivity): <2 ng/L (ref <18)

## 2022-02-07 LAB — POC URINE PREG, ED: Preg Test, Ur: NEGATIVE

## 2022-02-07 NOTE — ED Provider Notes (Signed)
[facility] Provider Note    Event Date/Time   First MD Initiated Contact with Patient 02/07/22 [phone]     (approximate)   History   Loss of Consciousness   HPI  Tiffany Rhodes is a 32 y.o. female who presents because of syncope.  Patient stood up this morning and felt dizzy and lightheaded.  She then fell to the ground and lost consciousness.  Denies any preceding chest pain palpitations or dyspnea.  She frequently feel lightheaded and have her vision go black when she stands up but has never syncopized before.  Denies any nausea vomiting diarrhea or GI losses.  Normal p.o. intake.  Currently she is asymptomatic.  Does have some mild hip pain from the fall but is ambulating     Past Medical History:  Diagnosis Date   Abnormal Pap smear of cervix    abnormal cells and pos HPV, has had neg after 2016 and 2018 pregnancies   Anxiety    BRCA negative 04/2020   MyRisk neg; IBIS=14.6%/riskscore=12.4%   Depression    Family history of ovarian cancer    mom   History of palpitations    during pregnancy - likely patent foramen ovale was only abnormality on echo   Ventral hernia     Patient Active Problem List   Diagnosis Date Noted   Moderate episode of recurrent major depressive disorder ([place]) 04/14/2019   GAD (generalized anxiety disorder) 03/12/2018   Psychophysiological insomnia 03/12/2018     Physical Exam  Triage Vital Signs: ED Triage Vitals  Enc Vitals Group     BP 02/07/22 0737 108/66     Pulse Rate 02/07/22 0737 75     Resp 02/07/22 0737 20     Temp 02/07/22 0737 98.4 F (36.9 C)     Temp Source 02/07/22 0737 Oral     SpO2 02/07/22 0737 100 %     Weight 02/07/22 0738 204 lb (92.5 kg)     Height 02/07/22 0738 5\' 4"  (1.626 m)     Head Circumference --      Peak Flow --      Pain Score 02/07/22 0738 2     Pain Loc --      Pain Edu? --      Excl. in [place]? --     Most recent vital signs: Vitals:   02/07/22 0737  BP: 108/66   Pulse: 75  Resp: 20  Temp: 98.4 F (36.9 C)  SpO2: 100%     General: Awake, no distress.  CV:  Good peripheral perfusion.  No murmurs Resp:  Normal effort.  Lungs are clear Abd:  No distention.  Neuro:             Awake, Alert, Oriented x 3  Other:     ED Results / Procedures / Treatments  Labs (all labs ordered are listed, but only abnormal results are displayed) Labs Reviewed  CBC - Abnormal; Notable for the following components:      Result Value   MCH 25.8 (*)    All other components within normal limits  COMPREHENSIVE METABOLIC PANEL - Abnormal; Notable for the following components:   Calcium 8.7 (*)    All other components within normal limits  POC URINE PREG, ED  TROPONIN I (HIGH SENSITIVITY)     EKG  EKG interpretation performed by myself: NSR, nml axis, nml intervals, no acute ischemic changes    RADIOLOGY    PROCEDURES:  Critical  Care performed: No  Procedures    MEDICATIONS ORDERED IN ED: Medications - No data to display   IMPRESSION / MDM / [place] / ED COURSE  I reviewed the triage vital signs and the nursing notes.                              Patient's presentation is most consistent with acute, uncomplicated illness.  Differential diagnosis includes, but is not limited to, orthostatic syncope, vasovagal syncope, less likely cardiac arrhythmia, anemia, dehydration  Patient is a previously healthy 32 year old female who presents after syncopal episode.  Patient felt lightheaded upon standing and subsequently syncopized.  She had no preceding chest pain palpitations or shortness of breath and is otherwise asymptomatic currently.  Has had symptoms of orthostasis in the past with her vision going out when she stands up too quickly but has never syncopized.  Patient vitals are reassuring overall she looks well cardiopulmonary exam is normal.  EKG without concerning features for cardiogenic etiology of syncope.  Labs including CBC  CMP troponin are all reassuring no anemia negative troponin pregnancy test negative.  Her history strongly just orthostatic syncope.  Discussed maintaining hydration and getting up slowly.  She is appropriate for discharge.       FINAL CLINICAL IMPRESSION(S) / ED DIAGNOSES   Final diagnoses:  Orthostatic syncope     Rx / DC Orders   ED Discharge Orders     None        Note:  This document was prepared using Dragon voice recognition software and may include unintentional dictation errors.   Rada Hay, MD 02/07/22 [phone]

## 2022-02-07 NOTE — ED Provider Triage Note (Signed)
Emergency Medicine Provider Triage Evaluation Note  Tiffany Rhodes , a 32 y.o. female  was evaluated in triage.  Pt complains of syncopal episode that occurred this morning.  Patient states that she woke up this morning feeling dizzy and was leaving the house at the time of the syncopal episode.  Patient complains of right hip pain but continues to be ambulatory.  She denies any head injury but complains of headache and dizziness at this time.  Review of Systems  Positive: dizziness Negative: No n/v  Physical Exam  BP 108/66 (BP Location: Left Arm)   Pulse 75   Temp 98.4 F (36.9 C) (Oral)   Resp 20   Ht 5\' 4"  (1.626 m)   Wt 92.5 kg   SpO2 100%   BMI 35.02 kg/m  Gen:   Awake, no distress, able to talk in complete sentences without difficulty. Resp:  Normal effort lungs are clear bilaterally. MSK:   Moves extremities without difficulty  Other:    Medical Decision Making  Medically screening exam initiated at 7:40 AM.  Appropriate orders placed.  Tiffany Rhodes was informed that the remainder of the evaluation will be completed by another provider, this initial triage assessment does not replace that evaluation, and the importance of remaining in the ED until their evaluation is complete.     Johnn Hai, PA-C 02/07/22 [phone]

## 2022-02-07 NOTE — Discharge Instructions (Signed)
Please make sure you are standing up slowly and if you feel lightheaded sit back down.  Please make sure you are drinking of water.  Your blood work and EKG were all reassuring.

## 2022-02-07 NOTE — ED Triage Notes (Signed)
Pt states she woke up this morning feeling dizzy, states when she was walking to leave the house she had syncopal episode. States no hx of the same or recent illness. Pt co right hip pain from fall is ambulatory to triage. Pt now co headache and dizziness.

## 2022-02-08 ENCOUNTER — Ambulatory Visit
Admission: RE | Admit: 2022-02-08 | Discharge: 2022-02-08 | Disposition: A | Discharge status: Home / Self Care | Payer: Medicaid Other | Source: Ambulatory Visit | Attending: Obstetrics and Gynecology | Admitting: Obstetrics and Gynecology

## 2022-02-08 ENCOUNTER — Other Ambulatory Visit: Payer: Self-pay | Admitting: Obstetrics and Gynecology

## 2022-02-08 DIAGNOSIS — R1031 Right lower quadrant pain: Secondary | ICD-10-CM | POA: Diagnosis not present

## 2022-02-08 DIAGNOSIS — Z30431 Encounter for routine checking of intrauterine contraceptive device: Secondary | ICD-10-CM

## 2022-02-08 DIAGNOSIS — N393 Stress incontinence (female) (male): Secondary | ICD-10-CM

## 2022-02-17 ENCOUNTER — Encounter: Payer: Self-pay | Admitting: Obstetrics and Gynecology

## 2022-02-19 ENCOUNTER — Other Ambulatory Visit: Payer: Self-pay

## 2022-02-19 DIAGNOSIS — N393 Stress incontinence (female) (male): Secondary | ICD-10-CM

## 2022-02-19 NOTE — Progress Notes (Signed)
Amb Ref to Pelvic Floor PT placed.

## 2022-02-19 NOTE — Telephone Encounter (Signed)
Do I need to do anything with this or just FYI? Thank you!

## 2022-02-20 DIAGNOSIS — E538 Deficiency of other specified B group vitamins: Secondary | ICD-10-CM | POA: Diagnosis not present

## 2022-02-20 DIAGNOSIS — Z9884 Bariatric surgery status: Secondary | ICD-10-CM | POA: Diagnosis not present

## 2022-02-26 DIAGNOSIS — Z79899 Other long term (current) drug therapy: Secondary | ICD-10-CM | POA: Diagnosis not present

## 2022-02-26 DIAGNOSIS — F329 Major depressive disorder, single episode, unspecified: Secondary | ICD-10-CM | POA: Diagnosis not present

## 2022-02-26 DIAGNOSIS — F411 Generalized anxiety disorder: Secondary | ICD-10-CM | POA: Diagnosis not present

## 2022-03-05 DIAGNOSIS — Z713 Dietary counseling and surveillance: Secondary | ICD-10-CM | POA: Diagnosis not present

## 2022-03-05 DIAGNOSIS — Z9884 Bariatric surgery status: Secondary | ICD-10-CM | POA: Diagnosis not present

## 2022-03-06 DIAGNOSIS — E538 Deficiency of other specified B group vitamins: Secondary | ICD-10-CM | POA: Diagnosis not present

## 2022-03-06 DIAGNOSIS — Z0000 Encounter for general adult medical examination without abnormal findings: Secondary | ICD-10-CM | POA: Diagnosis not present

## 2022-03-07 ENCOUNTER — Encounter: Payer: Self-pay | Admitting: Obstetrics and Gynecology

## 2022-03-13 DIAGNOSIS — D509 Iron deficiency anemia, unspecified: Secondary | ICD-10-CM | POA: Diagnosis not present

## 2022-03-13 DIAGNOSIS — F901 Attention-deficit hyperactivity disorder, predominantly hyperactive type: Secondary | ICD-10-CM | POA: Diagnosis not present

## 2022-04-02 ENCOUNTER — Ambulatory Visit: Payer: Medicaid Other | Attending: Obstetrics and Gynecology

## 2022-04-02 ENCOUNTER — Other Ambulatory Visit: Payer: Self-pay

## 2022-04-02 DIAGNOSIS — N393 Stress incontinence (female) (male): Secondary | ICD-10-CM | POA: Diagnosis not present

## 2022-04-02 DIAGNOSIS — M6281 Muscle weakness (generalized): Secondary | ICD-10-CM | POA: Diagnosis not present

## 2022-04-02 DIAGNOSIS — R293 Abnormal posture: Secondary | ICD-10-CM | POA: Diagnosis not present

## 2022-04-02 DIAGNOSIS — N3946 Mixed incontinence: Secondary | ICD-10-CM

## 2022-04-02 DIAGNOSIS — R279 Unspecified lack of coordination: Secondary | ICD-10-CM | POA: Diagnosis not present

## 2022-04-02 NOTE — Patient Instructions (Signed)
Double-voiding:  This technique is to help with post-void dribbling, or leaking a little bit when you stand up right after urinating.  Use relaxed toileting mechanics to urinate as much as you feel like you have to without straining.  Sit back upright from leaning forward and relax this way for 10-20 seconds.  Lean forward again to finish voiding any amount more.     [facility] [address], [place], [place] [address] Phone # [phone] Fax [phone]

## 2022-04-02 NOTE — Therapy (Addendum)
OUTPATIENT PHYSICAL THERAPY FEMALE PELVIC EVALUATION   Patient Name: Tiffany Rhodes MRN: [id number] DOB:01-Sep-1990, 32 y.o., female Today's Date: 04/02/2022  END OF SESSION:  PT End of Session - 04/02/22 0942     Visit Number 1    Date for PT Re-Evaluation 09/17/22    Authorization Type [employer]    PT Start Time 0932    PT Stop Time 1010    PT Time Calculation (min) 38 min    Activity Tolerance Patient tolerated treatment well    Behavior During Therapy [facility] for tasks assessed/performed             Past Medical History:  Diagnosis Date   Abnormal Pap smear of cervix    abnormal cells and pos HPV, has had neg after 2016 and 2018 pregnancies   Anxiety    BRCA negative 04/2020   MyRisk neg; IBIS=14.6%/riskscore=12.4%   Depression    Family history of ovarian cancer    mom   History of palpitations    during pregnancy - likely patent foramen ovale was only abnormality on echo   Ventral hernia    Past Surgical History:  Procedure Laterality Date   CESAREAN SECTION     X2   DENTAL SURGERY     GASTRIC BYPASS  2020   VENTRAL HERNIA REPAIR N/A 07/01/2020   Procedure: HERNIA REPAIR VENTRAL ADULT;  Surgeon: Robert Bellow, MD;  Location: [facility] ORS;  Service: General;  Laterality: N/A;  RNFA needed; TAPP Block per anesthesia   Patient Active Problem List   Diagnosis Date Noted   Moderate episode of recurrent major depressive disorder ([place]) 04/14/2019   GAD (generalized anxiety disorder) 03/12/2018   Psychophysiological insomnia 03/12/2018    PCP: Azzie Glatter, FNP  REFERRING PROVIDER: Chad Cordial, PA-C  REFERRING DIAG: N39.3 (ICD-10-CM) - SUI (stress urinary incontinence, female)  THERAPY DIAG:  Mixed incontinence  Muscle weakness (generalized)  Unspecified lack of coordination  Abnormal posture  Rationale for Evaluation and Treatment: Rehabilitation  ONSET DATE: several years   SUBJECTIVE:                                                                                                                                                                                            SUBJECTIVE STATEMENT: Pt states that she has a lot of leaking throughout the day, especially with sneezing, picking things up (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  Fluid intake: Yes: not discussed today    PAIN:  Are you having pain? Yes NPRS scale: 7/10 Pain location:  center low back  Pain type: aching  Pain description: intermittent   Aggravating factors: waking in the morning Relieving factors: movement  PRECAUTIONS: None  WEIGHT BEARING RESTRICTIONS: No  FALLS:  Has patient fallen in last 6 months? No  LIVING ENVIRONMENT: Lives with: lives with their family Lives in: House/apartment  OCCUPATION: Dealer  PLOF: Independent  PATIENT GOALS: stop leaking  PERTINENT HISTORY:  2 c/section, bariatric surgery 2020, hernia repair Lt lower quadrant Sexual abuse: No  BOWEL MOVEMENT: Pain with bowel movement: No Type of bowel movement:Frequency every day to every other day and Strain No Fully empty rectum: No Leakage: No Pads: No Fiber supplement: No  URINATION: Pain with urination: No Fully empty bladder: Yes: but will dribble once she stands up Stream: Strong Urgency: Yes: can't hold for very long  Frequency: every hour and a half, at least 1x/night Leakage: Urge to void, Coughing, Sneezing, Laughing, and Lifting Pads: Yes: sometimes   INTERCOURSE: Pain with intercourse: Initial Penetration Ability to have vaginal penetration:  Yes: does not prevent intercourse Climax: no pain Marinoff Scale: 1/3 *Has had IUD for 2.5 years and wonders if this has to do with discomfort - placement was checked  PREGNANCY: Vaginal deliveries 2 Tearing Yes: sutures C-section deliveries 2 Currently pregnant No  PROLAPSE: None   OBJECTIVE:  04/02/22: PATIENT SURVEYS:   [id number]  COGNITION: Overall cognitive  status: Within functional limits for tasks assessed     SENSATION: Light touch: Appears intact Proprioception: Appears intact  MUSCLE LENGTH:  FUNCTIONAL TESTS:  Large Lt weight shift, hips rotate Rt Single leg stance >10 seconds bil, needed time to prepare and balance  GAIT: Comments: Bil hip internal rotation   POSTURE: decreased lumbar lordosis, decreased thoracic kyphosis, anterior pelvic tilt, and Rt hip elevation   LUMBARAROM/PROM: WNL   PALPATION:   General  Tenderness in center low back at base of sacrum and L5; no diastasis recti present or doming, but weakness with sit-up; tenderness Rt lower quadrant where she experiences some discomfort - restriction present compared to other side                External Perineal Exam WNL                             Internal Pelvic Floor two tenderness points bil at anterior ILA  Patient confirms identification and approves PT to assess internal pelvic floor and treatment Yes  PELVIC MMT:   MMT eval  Vaginal 2/5, poor coordination, endurance 3 seconds, 10 repeat contractions  Internal Anal Sphincter   External Anal Sphincter   Puborectalis   Diastasis Recti   (Blank rows = not tested)        TONE: WNL  PROLAPSE: Grade 2 anterior vaginal wall laxity and Grade 1 posterior vaginal wall laxity   TODAY'S TREATMENT:  DATE:  04/02/22  EVAL  Neuromuscular re-education: Pelvic floor contraction training Quick flicks Long holds Therapeutic activities: Pressure management  Double-voiding   Check all possible CPT codes: [phone]- Neuro Re-education and [id number] - Therapeutic Activities    Check all conditions that are expected to impact treatment: None of these apply   If treatment provided at initial evaluation, no treatment charged due to lack of authorization.       PATIENT EDUCATION:  Education  details: see above Person educated: Patient Education method: Explanation, Demonstration, Tactile cues, Verbal cues, and Handouts Education comprehension: verbalized understanding  HOME EXERCISE PROGRAM: [id number]  ASSESSMENT:  CLINICAL IMPRESSION: Patient is a 32 y.o. female who was seen today for physical therapy evaluation and treatment for urinary incontinence and secondary complaint of low back pain/pelvic pain. Exam findings notable for abnormal posture, tenderness across low back/Rt lower quadrant, decreased functional mobility/strength, decreased pelvic floor strength 2/5, decreased pelvic floor endurance 3 seconds, and anterior/posterior vaginal wall laxity. Initial treatment included pelvic floor contraction training, education on pressure management, and double-voiding. She will continue to benefit form skilled PT intervention in order to decrease urinary incontinence, improve low back/pelvic pain, and perform work duties without difficulty.   OBJECTIVE IMPAIRMENTS: decreased activity tolerance, decreased coordination, decreased endurance, decreased strength, increased fascial restrictions, increased muscle spasms, postural dysfunction, and pain.   ACTIVITY LIMITATIONS: carrying, lifting, squatting, and continence  PARTICIPATION LIMITATIONS: interpersonal relationship, community activity, and occupation  PERSONAL FACTORS: 1-2 comorbidities: hernia surgery, 2 c/sections, bariatric surgery  are also affecting patient's functional outcome.   REHAB POTENTIAL: Good  CLINICAL DECISION MAKING: Stable/uncomplicated  EVALUATION COMPLEXITY: Low   GOALS: Goals reviewed with patient? Yes  SHORT TERM GOALS: Target date: 05/07/22  Pt will be independent with HEP.   Baseline: Goal status: INITIAL  2.  Pt will be able to correctly perform diaphragmatic breathing and appropriate pressure management in order to prevent worsening vaginal wall laxity and improve pelvic floor A/ROM.    Baseline:  Goal status: INITIAL  3.  Pt will be able to lift 25 lbs with good pressure management and no leaking.  Baseline:  Goal status: INITIAL  4.  Pt will be independent with the knack, urge suppression technique, and double voiding in order to improve bladder habits and decrease urinary incontinence.   Baseline:  Goal status: INITIAL   LONG TERM GOALS: Target date: 09/17/2022   Pt will be independent with advanced HEP.   Baseline:  Goal status: INITIAL  2.  Pt will demonstrate normal pelvic floor muscle tone and A/ROM, able to achieve 4/5 strength with contractions and 10 sec endurance, in order to provide appropriate lumbopelvic support in functional activities.   Baseline:  Goal status: INITIAL  3.  Pt will be able to lift >50lbs without pain, leaking, or difficulty in order to perform work duties. Baseline:  Goal status: INITIAL  4.  Pt will report no leaks with laughing, coughing, sneezing in order to improve comfort with interpersonal relationships and community activities.   Baseline:  Goal status: INITIAL  5.  Pt will be able to go 2-3 hours in between voids without urgency or incontinence in order to improve QOL and perform all functional activities with less difficulty.   Baseline:  Goal status: INITIAL   PLAN:  PT FREQUENCY: 1-2x/week  PT DURATION: 6 months  PLANNED INTERVENTIONS: Therapeutic exercises, Therapeutic activity, Neuromuscular re-education, Balance training, Gait training, Patient/Family education, Self Care, Joint mobilization, Dry Needling, Biofeedback, and Manual therapy  PLAN FOR  NEXT SESSION: the knack, urge suppression technique, lifting with pelvic floor contraction  Julio Alm, PT, DPT03/04/2408:37 AM  PHYSICAL THERAPY DISCHARGE SUMMARY  Visits from Start of Care: 1  Current functional level related to goals / functional outcomes: Not met   Remaining deficits: See above   Education / Equipment: HEP   Patient  agrees to discharge. Patient goals were not met. Patient is being discharged due to not returning since the last visit.  Julio Alm, PT, DPT05/06/249:48 AM

## 2022-04-10 DIAGNOSIS — F411 Generalized anxiety disorder: Secondary | ICD-10-CM | POA: Diagnosis not present

## 2022-04-10 DIAGNOSIS — F331 Major depressive disorder, recurrent, moderate: Secondary | ICD-10-CM | POA: Diagnosis not present

## 2022-05-21 ENCOUNTER — Ambulatory Visit: Payer: Medicaid Other

## 2022-05-21 DIAGNOSIS — F411 Generalized anxiety disorder: Secondary | ICD-10-CM | POA: Diagnosis not present

## 2022-05-21 DIAGNOSIS — F331 Major depressive disorder, recurrent, moderate: Secondary | ICD-10-CM | POA: Diagnosis not present

## 2022-05-25 DIAGNOSIS — F909 Attention-deficit hyperactivity disorder, unspecified type: Secondary | ICD-10-CM | POA: Diagnosis not present

## 2022-05-28 ENCOUNTER — Ambulatory Visit: Payer: Medicaid Other | Attending: Obstetrics and Gynecology

## 2022-05-28 DIAGNOSIS — M6281 Muscle weakness (generalized): Secondary | ICD-10-CM | POA: Insufficient documentation

## 2022-05-28 DIAGNOSIS — R293 Abnormal posture: Secondary | ICD-10-CM | POA: Insufficient documentation

## 2022-05-28 DIAGNOSIS — N393 Stress incontinence (female) (male): Secondary | ICD-10-CM | POA: Insufficient documentation

## 2022-05-28 DIAGNOSIS — R279 Unspecified lack of coordination: Secondary | ICD-10-CM | POA: Insufficient documentation

## 2022-05-29 DIAGNOSIS — F988 Other specified behavioral and emotional disorders with onset usually occurring in childhood and adolescence: Secondary | ICD-10-CM | POA: Diagnosis not present

## 2022-06-04 ENCOUNTER — Ambulatory Visit: Payer: Medicaid Other | Attending: Obstetrics and Gynecology

## 2022-06-04 ENCOUNTER — Telehealth: Payer: Self-pay

## 2022-06-04 DIAGNOSIS — R293 Abnormal posture: Secondary | ICD-10-CM | POA: Insufficient documentation

## 2022-06-04 DIAGNOSIS — M6281 Muscle weakness (generalized): Secondary | ICD-10-CM | POA: Insufficient documentation

## 2022-06-04 DIAGNOSIS — R279 Unspecified lack of coordination: Secondary | ICD-10-CM | POA: Insufficient documentation

## 2022-06-04 DIAGNOSIS — N393 Stress incontinence (female) (male): Secondary | ICD-10-CM | POA: Insufficient documentation

## 2022-06-04 NOTE — Telephone Encounter (Signed)
Patient called after second no-show appointment today. She was told we would be discharging her per our attendance policy.

## 2022-08-28 DIAGNOSIS — R4589 Other symptoms and signs involving emotional state: Secondary | ICD-10-CM | POA: Diagnosis not present

## 2022-08-28 DIAGNOSIS — R413 Other amnesia: Secondary | ICD-10-CM | POA: Diagnosis not present

## 2022-08-28 DIAGNOSIS — R41840 Attention and concentration deficit: Secondary | ICD-10-CM | POA: Diagnosis not present

## 2022-08-28 DIAGNOSIS — R454 Irritability and anger: Secondary | ICD-10-CM | POA: Diagnosis not present

## 2022-10-29 DIAGNOSIS — F411 Generalized anxiety disorder: Secondary | ICD-10-CM | POA: Diagnosis not present

## 2022-10-29 DIAGNOSIS — F3132 Bipolar disorder, current episode depressed, moderate: Secondary | ICD-10-CM | POA: Diagnosis not present

## 2022-10-30 DIAGNOSIS — M545 Low back pain, unspecified: Secondary | ICD-10-CM | POA: Diagnosis not present

## 2022-10-30 DIAGNOSIS — M5412 Radiculopathy, cervical region: Secondary | ICD-10-CM | POA: Diagnosis not present

## 2022-10-30 DIAGNOSIS — R454 Irritability and anger: Secondary | ICD-10-CM | POA: Diagnosis not present

## 2022-10-30 DIAGNOSIS — G8929 Other chronic pain: Secondary | ICD-10-CM | POA: Diagnosis not present

## 2022-10-30 DIAGNOSIS — R41840 Attention and concentration deficit: Secondary | ICD-10-CM | POA: Diagnosis not present

## 2022-10-30 DIAGNOSIS — M549 Dorsalgia, unspecified: Secondary | ICD-10-CM | POA: Diagnosis not present

## 2022-11-02 ENCOUNTER — Emergency Department (HOSPITAL_COMMUNITY): Payer: Medicaid Other

## 2022-11-02 ENCOUNTER — Other Ambulatory Visit: Payer: Self-pay

## 2022-11-02 ENCOUNTER — Emergency Department (HOSPITAL_COMMUNITY)
Admission: EM | Admit: 2022-11-02 | Discharge: 2022-11-02 | Disposition: A | Discharge status: Home / Self Care | Payer: Medicaid Other | Attending: Emergency Medicine | Admitting: Emergency Medicine

## 2022-11-02 DIAGNOSIS — R509 Fever, unspecified: Secondary | ICD-10-CM | POA: Diagnosis not present

## 2022-11-02 DIAGNOSIS — R1032 Left lower quadrant pain: Secondary | ICD-10-CM | POA: Insufficient documentation

## 2022-11-02 DIAGNOSIS — R1031 Right lower quadrant pain: Secondary | ICD-10-CM | POA: Insufficient documentation

## 2022-11-02 DIAGNOSIS — R197 Diarrhea, unspecified: Secondary | ICD-10-CM | POA: Diagnosis not present

## 2022-11-02 DIAGNOSIS — R1030 Lower abdominal pain, unspecified: Secondary | ICD-10-CM

## 2022-11-02 LAB — CBC WITH DIFFERENTIAL/PLATELET
Abs Immature Granulocytes: 0.01 10*3/uL (ref 0.00–0.07)
Basophils Absolute: 0 10*3/uL (ref 0.0–0.1)
Basophils Relative: 1 %
Eosinophils Absolute: 0.1 10*3/uL (ref 0.0–0.5)
Eosinophils Relative: 2 %
HCT: 39.6 % (ref 36.0–46.0)
Hemoglobin: 12.8 g/dL (ref 12.0–15.0)
Immature Granulocytes: 0 %
Lymphocytes Relative: 27 %
Lymphs Abs: 1.6 10*3/uL (ref 0.7–4.0)
MCH: 28.4 pg (ref 26.0–34.0)
MCHC: 32.3 g/dL (ref 30.0–36.0)
MCV: 87.8 fL (ref 80.0–100.0)
Monocytes Absolute: 0.5 10*3/uL (ref 0.1–1.0)
Monocytes Relative: 8 %
Neutro Abs: 3.7 10*3/uL (ref 1.7–7.7)
Neutrophils Relative %: 62 %
Platelets: 230 10*3/uL (ref 150–400)
RBC: 4.51 MIL/uL (ref 3.87–5.11)
RDW: 13.6 % (ref 11.5–15.5)
WBC: 5.9 10*3/uL (ref 4.0–10.5)
nRBC: 0 % (ref 0.0–0.2)

## 2022-11-02 LAB — PROTIME-INR
INR: 0.9 (ref 0.8–1.2)
Prothrombin Time: 12.3 s (ref 11.4–15.2)

## 2022-11-02 LAB — POC OCCULT BLOOD, ED: Fecal Occult Bld: NEGATIVE

## 2022-11-02 LAB — COMPREHENSIVE METABOLIC PANEL
ALT: 17 U/L (ref 0–44)
AST: 17 U/L (ref 15–41)
Albumin: 3.3 g/dL — ABNORMAL LOW (ref 3.5–5.0)
Alkaline Phosphatase: 50 U/L (ref 38–126)
Anion gap: 8 (ref 5–15)
BUN: 12 mg/dL (ref 6–20)
CO2: 23 mmol/L (ref 22–32)
Calcium: 8 mg/dL — ABNORMAL LOW (ref 8.9–10.3)
Chloride: 106 mmol/L (ref 98–111)
Creatinine, Ser: 0.85 mg/dL (ref 0.44–1.00)
GFR, Estimated: >60 mL/min (ref >60)
Glucose, Bld: 92 mg/dL (ref 70–99)
Potassium: 3.6 mmol/L (ref 3.5–5.1)
Sodium: 137 mmol/L (ref 135–145)
Total Bilirubin: 0.3 mg/dL (ref 0.3–1.2)
Total Protein: 5.7 g/dL — ABNORMAL LOW (ref 6.5–8.1)

## 2022-11-02 LAB — TYPE AND SCREEN
ABO/RH(D): A NEG
Antibody Screen: NEGATIVE

## 2022-11-02 LAB — HCG, QUANTITATIVE, PREGNANCY: hCG, Beta Chain, Quant, S: <1 m[IU]/mL (ref <5)

## 2022-11-02 MED ORDER — FENTANYL CITRATE PF 50 MCG/ML IJ SOSY
50.0000 ug | PREFILLED_SYRINGE | Freq: Once | INTRAMUSCULAR | Status: AC
  Administered 2022-11-02: 50 ug via INTRAVENOUS
  Filled 2022-11-02: qty 1

## 2022-11-02 MED ORDER — IOHEXOL 300 MG/ML  SOLN
100.0000 mL | Freq: Once | INTRAMUSCULAR | Status: AC | PRN
  Administered 2022-11-02: 100 mL via INTRAVENOUS

## 2022-11-02 MED ORDER — PANTOPRAZOLE SODIUM 40 MG IV SOLR
40.0000 mg | Freq: Once | INTRAVENOUS | Status: AC
  Administered 2022-11-02: 40 mg via INTRAVENOUS
  Filled 2022-11-02: qty 10

## 2022-11-02 MED ORDER — SODIUM CHLORIDE 0.9 % IV BOLUS
1000.0000 mL | Freq: Once | INTRAVENOUS | Status: AC
  Administered 2022-11-02: 1000 mL via INTRAVENOUS

## 2022-11-02 NOTE — ED Notes (Signed)
Patient transported to CT 

## 2022-11-02 NOTE — ED Triage Notes (Signed)
Pt reports lower abdominal pain, with watery black stool. Pt states that she feels the urge to defecate but can't. Endorses dizziness and chills. Denies nausea/vomiting.

## 2022-11-02 NOTE — Discharge Instructions (Signed)
You are seen in the emergency department for diarrhea and lower abdominal pain.  You had blood work, CAT scan and a rectal exam that did not show any evidence of bleeding or obvious cause of your symptoms.  Please start with a clear liquid diet advance as tolerated.  Follow-up with your regular doctor.  Return to the emergency department if any worsening or concerning symptoms

## 2022-11-02 NOTE — ED Provider Notes (Signed)
[place] EMERGENCY DEPARTMENT AT [facility] Provider Note   CSN: [id number] Arrival date & time: 11/02/22  1905     History {Add pertinent medical, surgical, social history, OB history to HPI:1} Chief Complaint  Patient presents with   Abdominal Pain    Pt reports lower abdominal pain, with watery black stool. Pt states that she feels the urge to defecate but can't. Endorses dizziness and chills. Denies nausea/vomiting.     Tiffany Rhodes is a 32 y.o. female.  She is here with a complaint of watery black diarrhea and lower abdominal pain that started yesterday afternoon.  Symptoms continued today associated with subjective fevers chills and dizziness.  No nausea or vomiting.  She is not on any blood thinners.  She is on iron supplements although has not noticed any dark stools before.  She has a past surgical history of multiple C-sections, hernia with mesh repair, gastric bypass that was done 4 years ago.  She is not on a PPI.  The history is provided by the patient.  Abdominal Pain Pain location:  RLQ, LLQ and suprapubic Pain quality: aching   Pain severity:  Moderate Onset quality:  Gradual Duration:  2 days Timing:  Intermittent Progression:  Unchanged Chronicity:  New Context: not trauma   Relieved by:  None tried Worsened by:  Nothing Ineffective treatments:  None tried Associated symptoms: chills, diarrhea and fever   Associated symptoms: no chest pain, no dysuria, no hematemesis, no nausea, no shortness of breath, no vaginal bleeding and no vomiting   Risk factors: multiple surgeries        Home Medications Prior to Admission medications   Medication Sig Start Date End Date Taking? Authorizing Provider  amphetamine-dextroamphetamine (ADDERALL XR) 15 MG 24 hr capsule Take by mouth. 02/03/22 03/05/22  [provider]  amphetamine-dextroamphetamine (ADDERALL XR) 20 MG 24 hr capsule Take 20 mg by mouth daily.    [provider]   ferrous sulfate 325 (65 FE) MG tablet Take 1 tablet by mouth 2 times daily. (Take on an empty stomach 30 minutes before breakfast and dinner. Take with orange juice for better absorption. Dark stools and constipation are common side effects. Increase fluids). 10/19/21   [provider]  lamoTRIgine (LAMICTAL) 100 MG tablet Take 1 tablet by mouth at bedtime. 01/25/22   [provider]  levonorgestrel (MIRENA) 20 MCG/DAY IUD 1 each by Intrauterine route once.    [provider]  levonorgestrel (MIRENA, 52 MG,) 20 MCG/24HR IUD 1 Intra Uterine Device (1 each total) by Intrauterine route once for 1 dose. 04/28/20 04/28/20  Copland, Ilona Sorrel, PA-C  topiramate (TOPAMAX) 25 MG tablet Take by mouth. 12/11/21   [provider]  traZODone (DESYREL) 50 MG tablet TAKE 0.5-1 TABLETS BY MOUTH AT BEDTIME AS NEEDED FOR SLEEP. Patient not taking: Reported on 04/02/2022 02/03/20   Tarri Fuller, FNP  sertraline (ZOLOFT) 100 MG tablet Take 1.5 tablets (150 mg total) by mouth daily. Patient not taking: Reported on 09/18/2019 05/07/19 09/25/19  Tarri Fuller, FNP      Allergies    Percocet [oxycodone-acetaminophen] and Buspirone    Review of Systems   Review of Systems  Constitutional:  Positive for chills and fever.  Respiratory:  Negative for shortness of breath.   Cardiovascular:  Negative for chest pain.  Gastrointestinal:  Positive for abdominal pain and diarrhea. Negative for hematemesis, nausea and vomiting.  Genitourinary:  Negative for dysuria and vaginal bleeding.  Physical Exam Updated Vital Signs BP 107/65 (BP Location: Right Arm)   Pulse 79   Temp 98.4 F (36.9 C) (Oral)   Resp 18   Ht 5\' 4"  (1.626 m)   Wt 86.2 kg   SpO2 100%   BMI 32.61 kg/m  Physical Exam Vitals and nursing note reviewed.  Constitutional:      General: She is not in acute distress.    Appearance: Normal appearance. She is well-developed.  HENT:     Head: Normocephalic and atraumatic.   Eyes:     Conjunctiva/sclera: Conjunctivae normal.  Cardiovascular:     Rate and Rhythm: Normal rate and regular rhythm.     Heart sounds: No murmur heard. Pulmonary:     Effort: Pulmonary effort is normal. No respiratory distress.     Breath sounds: Normal breath sounds.  Abdominal:     Palpations: Abdomen is soft.     Tenderness: There is no abdominal tenderness. There is no guarding or rebound.  Musculoskeletal:        General: No swelling.     Cervical back: Neck supple.  Skin:    General: Skin is warm and dry.     Capillary Refill: Capillary refill takes less than 2 seconds.  Neurological:     General: No focal deficit present.     Mental Status: She is alert.     Motor: No weakness.     Gait: Gait normal.     ED Results / Procedures / Treatments   Labs (all labs ordered are listed, but only abnormal results are displayed) Labs Reviewed - No data to display  EKG None  Radiology No results found.  Procedures Procedures  {Document cardiac monitor, telemetry assessment procedure when appropriate:1}  Medications Ordered in ED Medications  sodium chloride 0.9 % bolus 1,000 mL (has no administration in time range)  pantoprazole (PROTONIX) injection 40 mg (has no administration in time range)  fentaNYL (SUBLIMAZE) injection 50 mcg (has no administration in time range)    ED Course/ Medical Decision Making/ A&P   {   Click here for ABCD2, HEART and other calculatorsREFRESH Note before signing :1}                              Medical Decision Making Amount and/or Complexity of Data Reviewed Labs: ordered.  Risk Prescription drug management.   This patient complains of ***; this involves an extensive number of treatment Options and is a complaint that carries with it a high risk of complications and morbidity. The differential includes ***  I ordered, reviewed and interpreted labs, which included *** I ordered medication *** and reviewed PMP when  indicated. I ordered imaging studies which included *** and I independently    visualized and interpreted imaging which showed *** Additional history obtained from *** Previous records obtained and reviewed *** I consulted *** and discussed lab and imaging findings and discussed disposition.  Cardiac monitoring reviewed, *** Social determinants considered, *** Critical Interventions: ***  After the interventions stated above, I reevaluated the patient and found *** Admission and further testing considered, ***   {Document critical care time when appropriate:1} {Document review of labs and clinical decision tools ie heart score, Chads2Vasc2 etc:1}  {Document your independent review of radiology images, and any outside records:1} {Document your discussion with family members, caretakers, and with consultants:1} {Document social determinants of health affecting pt's care:1} {Document your decision making why or why  not admission, treatments were needed:1} Final Clinical Impression(s) / ED Diagnoses Final diagnoses:  None    Rx / DC Orders ED Discharge Orders     None

## 2022-11-05 DIAGNOSIS — F3132 Bipolar disorder, current episode depressed, moderate: Secondary | ICD-10-CM | POA: Diagnosis not present

## 2022-11-05 DIAGNOSIS — F411 Generalized anxiety disorder: Secondary | ICD-10-CM | POA: Diagnosis not present

## 2022-11-29 DIAGNOSIS — M545 Low back pain, unspecified: Secondary | ICD-10-CM | POA: Diagnosis not present

## 2022-11-29 DIAGNOSIS — M542 Cervicalgia: Secondary | ICD-10-CM | POA: Diagnosis not present

## 2022-11-29 DIAGNOSIS — M546 Pain in thoracic spine: Secondary | ICD-10-CM | POA: Diagnosis not present

## 2022-11-29 DIAGNOSIS — G8929 Other chronic pain: Secondary | ICD-10-CM | POA: Diagnosis not present

## 2022-12-04 DIAGNOSIS — F901 Attention-deficit hyperactivity disorder, predominantly hyperactive type: Secondary | ICD-10-CM | POA: Diagnosis not present

## 2022-12-04 DIAGNOSIS — Z9884 Bariatric surgery status: Secondary | ICD-10-CM | POA: Diagnosis not present

## 2023-01-14 DIAGNOSIS — M5416 Radiculopathy, lumbar region: Secondary | ICD-10-CM | POA: Diagnosis not present

## 2023-01-14 DIAGNOSIS — M542 Cervicalgia: Secondary | ICD-10-CM | POA: Diagnosis not present

## 2023-01-14 DIAGNOSIS — M47816 Spondylosis without myelopathy or radiculopathy, lumbar region: Secondary | ICD-10-CM | POA: Diagnosis not present

## 2023-01-14 DIAGNOSIS — M5412 Radiculopathy, cervical region: Secondary | ICD-10-CM | POA: Diagnosis not present

## 2023-02-15 DIAGNOSIS — F411 Generalized anxiety disorder: Secondary | ICD-10-CM | POA: Diagnosis not present

## 2023-02-15 DIAGNOSIS — F3132 Bipolar disorder, current episode depressed, moderate: Secondary | ICD-10-CM | POA: Diagnosis not present

## 2023-02-17 DIAGNOSIS — M5412 Radiculopathy, cervical region: Secondary | ICD-10-CM | POA: Diagnosis not present

## 2023-02-17 DIAGNOSIS — M5023 Other cervical disc displacement, cervicothoracic region: Secondary | ICD-10-CM | POA: Diagnosis not present

## 2023-02-17 DIAGNOSIS — M50121 Cervical disc disorder at C4-C5 level with radiculopathy: Secondary | ICD-10-CM | POA: Diagnosis not present

## 2023-02-17 DIAGNOSIS — M5116 Intervertebral disc disorders with radiculopathy, lumbar region: Secondary | ICD-10-CM | POA: Diagnosis not present

## 2023-02-17 DIAGNOSIS — R609 Edema, unspecified: Secondary | ICD-10-CM | POA: Diagnosis not present

## 2023-02-17 DIAGNOSIS — M4802 Spinal stenosis, cervical region: Secondary | ICD-10-CM | POA: Diagnosis not present

## 2023-02-17 DIAGNOSIS — M5117 Intervertebral disc disorders with radiculopathy, lumbosacral region: Secondary | ICD-10-CM | POA: Diagnosis not present

## 2023-02-17 DIAGNOSIS — M4726 Other spondylosis with radiculopathy, lumbar region: Secondary | ICD-10-CM | POA: Diagnosis not present

## 2023-02-17 DIAGNOSIS — M5416 Radiculopathy, lumbar region: Secondary | ICD-10-CM | POA: Diagnosis not present

## 2023-02-17 DIAGNOSIS — M4722 Other spondylosis with radiculopathy, cervical region: Secondary | ICD-10-CM | POA: Diagnosis not present

## 2023-02-21 DIAGNOSIS — Z9884 Bariatric surgery status: Secondary | ICD-10-CM | POA: Diagnosis not present

## 2023-02-21 DIAGNOSIS — Z7182 Exercise counseling: Secondary | ICD-10-CM | POA: Diagnosis not present

## 2023-02-21 DIAGNOSIS — Z713 Dietary counseling and surveillance: Secondary | ICD-10-CM | POA: Diagnosis not present

## 2023-02-21 DIAGNOSIS — Z7689 Persons encountering health services in other specified circumstances: Secondary | ICD-10-CM | POA: Diagnosis not present

## 2023-02-21 DIAGNOSIS — E669 Obesity, unspecified: Secondary | ICD-10-CM | POA: Diagnosis not present

## 2023-03-07 DIAGNOSIS — R634 Abnormal weight loss: Secondary | ICD-10-CM | POA: Diagnosis not present

## 2023-03-07 DIAGNOSIS — E669 Obesity, unspecified: Secondary | ICD-10-CM | POA: Diagnosis not present

## 2023-03-11 DIAGNOSIS — G8929 Other chronic pain: Secondary | ICD-10-CM | POA: Diagnosis not present

## 2023-03-11 DIAGNOSIS — M545 Low back pain, unspecified: Secondary | ICD-10-CM | POA: Diagnosis not present

## 2023-03-15 DIAGNOSIS — F3132 Bipolar disorder, current episode depressed, moderate: Secondary | ICD-10-CM | POA: Diagnosis not present

## 2023-03-15 DIAGNOSIS — F411 Generalized anxiety disorder: Secondary | ICD-10-CM | POA: Diagnosis not present

## 2023-03-28 DIAGNOSIS — E663 Overweight: Secondary | ICD-10-CM | POA: Diagnosis not present

## 2023-04-04 DIAGNOSIS — M545 Low back pain, unspecified: Secondary | ICD-10-CM | POA: Diagnosis not present

## 2023-04-04 DIAGNOSIS — G8929 Other chronic pain: Secondary | ICD-10-CM | POA: Diagnosis not present

## 2023-04-12 DIAGNOSIS — F3132 Bipolar disorder, current episode depressed, moderate: Secondary | ICD-10-CM | POA: Diagnosis not present

## 2023-04-12 DIAGNOSIS — F411 Generalized anxiety disorder: Secondary | ICD-10-CM | POA: Diagnosis not present

## 2023-04-23 DIAGNOSIS — R632 Polyphagia: Secondary | ICD-10-CM | POA: Diagnosis not present

## 2023-04-23 DIAGNOSIS — E663 Overweight: Secondary | ICD-10-CM | POA: Diagnosis not present

## 2023-05-08 DIAGNOSIS — F3132 Bipolar disorder, current episode depressed, moderate: Secondary | ICD-10-CM | POA: Diagnosis not present

## 2023-05-08 DIAGNOSIS — F411 Generalized anxiety disorder: Secondary | ICD-10-CM | POA: Diagnosis not present

## 2023-05-21 DIAGNOSIS — R634 Abnormal weight loss: Secondary | ICD-10-CM | POA: Diagnosis not present

## 2023-05-21 DIAGNOSIS — E663 Overweight: Secondary | ICD-10-CM | POA: Diagnosis not present

## 2023-06-04 DIAGNOSIS — F411 Generalized anxiety disorder: Secondary | ICD-10-CM | POA: Diagnosis not present

## 2023-06-04 DIAGNOSIS — F3132 Bipolar disorder, current episode depressed, moderate: Secondary | ICD-10-CM | POA: Diagnosis not present

## 2023-06-20 DIAGNOSIS — E663 Overweight: Secondary | ICD-10-CM | POA: Diagnosis not present

## 2023-06-20 DIAGNOSIS — R630 Anorexia: Secondary | ICD-10-CM | POA: Diagnosis not present

## 2023-08-01 DIAGNOSIS — F902 Attention-deficit hyperactivity disorder, combined type: Secondary | ICD-10-CM | POA: Diagnosis not present

## 2023-08-01 DIAGNOSIS — F3132 Bipolar disorder, current episode depressed, moderate: Secondary | ICD-10-CM | POA: Diagnosis not present

## 2023-08-01 DIAGNOSIS — F411 Generalized anxiety disorder: Secondary | ICD-10-CM | POA: Diagnosis not present

## 2023-08-08 DIAGNOSIS — F331 Major depressive disorder, recurrent, moderate: Secondary | ICD-10-CM | POA: Diagnosis not present

## 2023-08-08 DIAGNOSIS — E663 Overweight: Secondary | ICD-10-CM | POA: Diagnosis not present

## 2023-08-20 DIAGNOSIS — F331 Major depressive disorder, recurrent, moderate: Secondary | ICD-10-CM | POA: Diagnosis not present

## 2023-09-24 DIAGNOSIS — E663 Overweight: Secondary | ICD-10-CM | POA: Diagnosis not present

## 2023-09-24 DIAGNOSIS — Z7689 Persons encountering health services in other specified circumstances: Secondary | ICD-10-CM | POA: Diagnosis not present

## 2023-09-26 DIAGNOSIS — F902 Attention-deficit hyperactivity disorder, combined type: Secondary | ICD-10-CM | POA: Diagnosis not present

## 2023-09-26 DIAGNOSIS — F411 Generalized anxiety disorder: Secondary | ICD-10-CM | POA: Diagnosis not present

## 2023-09-26 DIAGNOSIS — F3132 Bipolar disorder, current episode depressed, moderate: Secondary | ICD-10-CM | POA: Diagnosis not present

## 2023-10-08 DIAGNOSIS — F331 Major depressive disorder, recurrent, moderate: Secondary | ICD-10-CM | POA: Diagnosis not present

## 2023-10-17 DIAGNOSIS — M542 Cervicalgia: Secondary | ICD-10-CM | POA: Diagnosis not present

## 2023-10-17 DIAGNOSIS — M47816 Spondylosis without myelopathy or radiculopathy, lumbar region: Secondary | ICD-10-CM | POA: Diagnosis not present

## 2023-10-17 DIAGNOSIS — M47812 Spondylosis without myelopathy or radiculopathy, cervical region: Secondary | ICD-10-CM | POA: Diagnosis not present

## 2023-10-23 DIAGNOSIS — M5416 Radiculopathy, lumbar region: Secondary | ICD-10-CM | POA: Diagnosis not present

## 2023-11-06 DIAGNOSIS — G5603 Carpal tunnel syndrome, bilateral upper limbs: Secondary | ICD-10-CM | POA: Diagnosis not present

## 2023-11-06 DIAGNOSIS — M47812 Spondylosis without myelopathy or radiculopathy, cervical region: Secondary | ICD-10-CM | POA: Diagnosis not present

## 2023-11-06 DIAGNOSIS — E538 Deficiency of other specified B group vitamins: Secondary | ICD-10-CM | POA: Diagnosis not present

## 2023-11-19 DIAGNOSIS — F331 Major depressive disorder, recurrent, moderate: Secondary | ICD-10-CM | POA: Diagnosis not present

## 2023-11-21 DIAGNOSIS — F411 Generalized anxiety disorder: Secondary | ICD-10-CM | POA: Diagnosis not present

## 2023-11-21 DIAGNOSIS — F902 Attention-deficit hyperactivity disorder, combined type: Secondary | ICD-10-CM | POA: Diagnosis not present

## 2023-11-25 DIAGNOSIS — M47816 Spondylosis without myelopathy or radiculopathy, lumbar region: Secondary | ICD-10-CM | POA: Diagnosis not present

## 2023-12-02 DIAGNOSIS — F331 Major depressive disorder, recurrent, moderate: Secondary | ICD-10-CM | POA: Diagnosis not present

## 2023-12-24 DIAGNOSIS — F331 Major depressive disorder, recurrent, moderate: Secondary | ICD-10-CM | POA: Diagnosis not present

## 2024-01-01 DIAGNOSIS — M47816 Spondylosis without myelopathy or radiculopathy, lumbar region: Secondary | ICD-10-CM | POA: Diagnosis not present

## 2024-01-13 DIAGNOSIS — F331 Major depressive disorder, recurrent, moderate: Secondary | ICD-10-CM | POA: Diagnosis not present

## 2024-01-16 DIAGNOSIS — F411 Generalized anxiety disorder: Secondary | ICD-10-CM | POA: Diagnosis not present

## 2024-01-16 DIAGNOSIS — F902 Attention-deficit hyperactivity disorder, combined type: Secondary | ICD-10-CM | POA: Diagnosis not present

## 2024-01-16 DIAGNOSIS — F3132 Bipolar disorder, current episode depressed, moderate: Secondary | ICD-10-CM | POA: Diagnosis not present
